# Patient Record
Sex: Male | Born: 1945
Health system: Southern US, Community
[De-identification: ages and names within clinical notes are randomized; demographics above are authoritative.]

## PROBLEM LIST (undated history)

## (undated) DIAGNOSIS — I10 Essential (primary) hypertension: Secondary | ICD-10-CM

## (undated) HISTORY — DX: Essential (primary) hypertension: I10

---

## 2014-02-19 ENCOUNTER — Ambulatory Visit (INDEPENDENT_AMBULATORY_CARE_PROVIDER_SITE_OTHER): Payer: Self-pay | Admitting: Family Medicine

## 2014-02-19 ENCOUNTER — Encounter: Payer: Self-pay | Admitting: Family Medicine

## 2014-02-19 ENCOUNTER — Encounter (INDEPENDENT_AMBULATORY_CARE_PROVIDER_SITE_OTHER): Payer: Self-pay

## 2014-02-19 VITALS — Temp 97.3°F | Ht 71.0 in | Wt 178.0 lb

## 2014-02-19 DIAGNOSIS — R21 Rash and other nonspecific skin eruption: Secondary | ICD-10-CM

## 2014-02-19 MED ORDER — TRIAMCINOLONE ACETONIDE 0.1 % EX CREA: TOPICAL_CREAM | CUTANEOUS | Status: DC

## 2014-02-19 MED ORDER — KETOCONAZOLE 2 % EX CREA: 1.0000 "application " | TOPICAL_CREAM | Freq: Two times a day (BID) | CUTANEOUS | Status: DC

## 2014-02-19 MED ORDER — DOXYCYCLINE HYCLATE 100 MG PO TABS: 100.0000 mg | ORAL_TABLET | Freq: Two times a day (BID) | ORAL | Status: DC
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
## 2014-03-29 ENCOUNTER — Ambulatory Visit: Payer: Self-pay | Admitting: Family

## 2014-03-29 VITALS — BP 155/74 | HR 75 | Temp 97.0°F | Ht 71.0 in | Wt 179.2 lb

## 2014-03-29 DIAGNOSIS — L259 Unspecified contact dermatitis, unspecified cause: Secondary | ICD-10-CM

## 2014-03-29 MED ORDER — METHYLPREDNISOLONE (PAK) 4 MG PO TABS: ORAL_TABLET | ORAL | Status: DC

## 2014-03-29 MED ORDER — METHYLPREDNISOLONE ACETATE 80 MG/ML IJ SUSP
80.0000 mg | Freq: Once | INTRAMUSCULAR | Status: AC
  Administered 2014-03-29: 80 mg via INTRAMUSCULAR

## 2014-03-29 MED ORDER — TRIAMCINOLONE ACETONIDE 0.5 % EX OINT: 1.0000 "application " | TOPICAL_OINTMENT | Freq: Two times a day (BID) | CUTANEOUS | Status: DC

## 2014-03-29 NOTE — Patient Instructions (Signed)

## 2014-03-29 NOTE — Progress Notes (Signed)
Subjective:    Patient ID: Michael Washington, male    DOB: 10-May-1945, 69 y.o.   MRN: [id number]  Rash This is a recurrent problem. The current episode started more than 1 month ago (December). The affected locations include the left arm and right arm. The rash is characterized by itchiness and redness. He was exposed to nothing. Pertinent negatives include no congestion. Past treatments include antibiotics, anti-itch cream and moisturizer (Ketoconazole). The treatment provided mild relief. There is no history of allergies or asthma.      Review of Systems  Constitutional: Negative.   HENT: Negative.  Negative for congestion.   Respiratory: Negative.   Cardiovascular: Negative.   Gastrointestinal: Negative.   Endocrine: Negative.   Genitourinary: Negative.   Musculoskeletal: Negative.   Skin: Positive for rash.  Neurological: Negative.   Hematological: Negative.   Psychiatric/Behavioral: Negative.   All other systems reviewed and are negative.      Objective:   Physical Exam  Constitutional: He is oriented to person, place, and time. He appears well-developed and well-nourished. No distress.  Eyes: Pupils are equal, round, and reactive to light. Right eye exhibits no discharge. Left eye exhibits no discharge.  Neck: Normal range of motion. Neck supple. No thyromegaly present.  Cardiovascular: Normal rate, regular rhythm, normal heart sounds and intact distal pulses.   No murmur heard. Pulmonary/Chest: Effort normal and breath sounds normal. No respiratory distress. He has no wheezes.  Abdominal: Soft. Bowel sounds are normal. He exhibits no distension. There is no tenderness.  Musculoskeletal: Normal range of motion. He exhibits no edema or tenderness.  Neurological: He is alert and oriented to person, place, and time. He has normal reflexes. No cranial nerve deficit.  Skin: Skin is warm and dry. Rash (Bilateral lower arms generalized erythemas rash ) noted. No erythema.    Psychiatric: He has a normal mood and affect. His behavior is normal. Judgment and thought content normal.  Vitals reviewed.   BP 155/74 mmHg  Pulse 75  Temp(Src) 97 F (36.1 C) (Oral)  Ht 5\' 11"  (1.803 m)  Wt 179 lb 4 oz (81.307 kg)  BMI 25.01 kg/m2       Assessment & Plan:  1. Contact dermatitis -Do not scratch -Keep clean and dry -S/S of infection discussed -RTO prn - methylPREDNISolone acetate (DEPO-MEDROL) injection 80 mg; Inject 1 mL (80 mg total) into the muscle once. - methylPREDNIsolone (MEDROL DOSPACK) 4 MG tablet; follow package directions  Dispense: 21 tablet; Refill: 0 - triamcinolone ointment (KENALOG) 0.5 %; Apply 1 application topically 2 (two) times daily.  Dispense: 30 g; Refill: 0  Jannifer Rodney, FNP

## 2014-12-08 ENCOUNTER — Ambulatory Visit (INDEPENDENT_AMBULATORY_CARE_PROVIDER_SITE_OTHER): Payer: Medicare Other | Admitting: Family

## 2014-12-08 ENCOUNTER — Encounter: Payer: Self-pay | Admitting: Family

## 2014-12-08 VITALS — BP 158/93 | HR 84 | Temp 97.7°F | Ht 71.0 in | Wt 175.2 lb

## 2014-12-08 DIAGNOSIS — L259 Unspecified contact dermatitis, unspecified cause: Secondary | ICD-10-CM

## 2014-12-08 MED ORDER — METHYLPREDNISOLONE 4 MG PO TBPK: ORAL_TABLET | ORAL | Status: DC

## 2014-12-08 MED ORDER — TRIAMCINOLONE ACETONIDE 0.5 % EX OINT: 1.0000 "application " | TOPICAL_OINTMENT | Freq: Two times a day (BID) | CUTANEOUS | Status: DC
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
## 2014-12-08 NOTE — Patient Instructions (Signed)
Contact Dermatitis Dermatitis is redness, soreness, and swelling (inflammation) of the skin. Contact dermatitis is a reaction to certain substances that touch the skin. There are two types of contact dermatitis:   Irritant contact dermatitis. This type is caused by something that irritates your skin, such as dry hands from washing them too much. This type does not require previous exposure to the substance for a reaction to occur. This type is more common.  Allergic contact dermatitis. This type is caused by a substance that you are allergic to, such as a nickel allergy or poison ivy. This type only occurs if you have been exposed to the substance (allergen) before. Upon a repeat exposure, your body reacts to the substance. This type is less common. CAUSES  Many different substances can cause contact dermatitis. Irritant contact dermatitis is most commonly caused by exposure to:   Makeup.   Soaps.   Detergents.   Bleaches.   Acids.   Metal salts, such as nickel.  Allergic contact dermatitis is most commonly caused by exposure to:   Poisonous plants.   Chemicals.   Jewelry.   Latex.   Medicines.   Preservatives in products, such as clothing.  RISK FACTORS This condition is more likely to develop in:   People who have jobs that expose them to irritants or allergens.  People who have certain medical conditions, such as asthma or eczema.  SYMPTOMS  Symptoms of this condition may occur anywhere on your body where the irritant has touched you or is touched by you. Symptoms include:  Dryness or flaking.   Redness.   Cracks.   Itching.   Pain or a burning feeling.   Blisters.  Drainage of small amounts of blood or clear fluid from skin cracks. With allergic contact dermatitis, there may also be swelling in areas such as the eyelids, mouth, or genitals.  DIAGNOSIS  This condition is diagnosed with a medical history and physical exam. A patch skin test  may be performed to help determine the cause. If the condition is related to your job, you may need to see an occupational medicine specialist. TREATMENT Treatment for this condition includes figuring out what caused the reaction and protecting your skin from further contact. Treatment may also include:   Steroid creams or ointments. Oral steroid medicines may be needed in more severe cases.  Antibiotics or antibacterial ointments, if a skin infection is present.  Antihistamine lotion or an antihistamine taken by mouth to ease itching.  A bandage (dressing). HOME CARE INSTRUCTIONS Skin Care  Moisturize your skin as needed.   Apply cool compresses to the affected areas.  Try taking a bath with:  Epsom salts. Follow the instructions on the packaging. You can get these at your local pharmacy or grocery store.  Baking soda. Pour a small amount into the bath as directed by your health care provider.  Colloidal oatmeal. Follow the instructions on the packaging. You can get this at your local pharmacy or grocery store.  Try applying baking soda paste to your skin. Stir water into baking soda until it reaches a paste-like consistency.  Do not scratch your skin.  Bathe less frequently, such as every other day.  Bathe in lukewarm water. Avoid using hot water. Medicines  Take or apply over-the-counter and prescription medicines only as told by your health care provider.   If you were prescribed an antibiotic medicine, take or apply your antibiotic as told by your health care provider. Do not stop using the   antibiotic even if your condition starts to improve. General Instructions  Keep all follow-up visits as told by your health care provider. This is important.  Avoid the substance that caused your reaction. If you do not know what caused it, keep a journal to try to track what caused it. Write down:  What you eat.  What cosmetic products you use.  What you drink.  What  you wear in the affected area. This includes jewelry.  If you were given a dressing, take care of it as told by your health care provider. This includes when to change and remove it. SEEK MEDICAL CARE IF:   Your condition does not improve with treatment.  Your condition gets worse.  You have signs of infection such as swelling, tenderness, redness, soreness, or warmth in the affected area.  You have a fever.  You have new symptoms. SEEK IMMEDIATE MEDICAL CARE IF:   You have a severe headache, neck pain, or neck stiffness.  You vomit.  You feel very sleepy.  You notice red streaks coming from the affected area.  Your bone or joint underneath the affected area becomes painful after the skin has healed.  The affected area turns darker.  You have difficulty breathing.   This information is not intended to replace advice given to you by your health care provider. Make sure you discuss any questions you have with your health care provider.   Document Released: 01/08/2000 Document Revised: 10/01/2014 Document Reviewed: 05/28/2014 Elsevier Interactive Patient Education 2016 [employer]  

## 2014-12-17 ENCOUNTER — Telehealth: Payer: Self-pay | Admitting: Family

## 2014-12-17 DIAGNOSIS — L259 Unspecified contact dermatitis, unspecified cause: Secondary | ICD-10-CM

## 2014-12-17 MED ORDER — METHYLPREDNISOLONE 4 MG PO TBPK: ORAL_TABLET | ORAL | Status: DC

## 2014-12-17 NOTE — Telephone Encounter (Signed)
Patient has developed rash on his back and legs now, arms are better, has finished Prednisone.  Wants to know if [facility] can [facility].

## 2014-12-17 NOTE — Telephone Encounter (Signed)
Prescription sent to pharmacy.

## 2015-01-08 ENCOUNTER — Encounter: Payer: Self-pay | Admitting: Family

## 2015-01-08 ENCOUNTER — Ambulatory Visit (INDEPENDENT_AMBULATORY_CARE_PROVIDER_SITE_OTHER): Payer: Medicare Other | Admitting: Family

## 2015-01-08 VITALS — BP 166/99 | HR 79 | Temp 97.6°F | Ht 71.0 in | Wt 174.2 lb

## 2015-01-08 DIAGNOSIS — Q828 Other specified congenital malformations of skin: Secondary | ICD-10-CM

## 2015-01-08 DIAGNOSIS — Q809 Congenital ichthyosis, unspecified: Secondary | ICD-10-CM

## 2015-01-08 DIAGNOSIS — L259 Unspecified contact dermatitis, unspecified cause: Secondary | ICD-10-CM | POA: Diagnosis not present

## 2015-01-08 MED ORDER — NYSTATIN-TRIAMCINOLONE 100000-0.1 UNIT/GM-% EX OINT: 1.0000 "application " | TOPICAL_OINTMENT | Freq: Two times a day (BID) | CUTANEOUS | Status: DC

## 2015-01-08 MED ORDER — METHYLPREDNISOLONE ACETATE 80 MG/ML IJ SUSP
80.0000 mg | Freq: Once | INTRAMUSCULAR | Status: AC
  Administered 2015-01-08: 80 mg via INTRAMUSCULAR

## 2015-01-08 MED ORDER — PREDNISONE 10 MG (21) PO TBPK: 10.0000 mg | ORAL_TABLET | Freq: Every day | ORAL | Status: DC

## 2015-01-08 NOTE — Patient Instructions (Signed)
Contact Dermatitis Dermatitis is redness, soreness, and swelling (inflammation) of the skin. Contact dermatitis is a reaction to certain substances that touch the skin. There are two types of contact dermatitis:   Irritant contact dermatitis. This type is caused by something that irritates your skin, such as dry hands from washing them too much. This type does not require previous exposure to the substance for a reaction to occur. This type is more common.  Allergic contact dermatitis. This type is caused by a substance that you are allergic to, such as a nickel allergy or poison ivy. This type only occurs if you have been exposed to the substance (allergen) before. Upon a repeat exposure, your body reacts to the substance. This type is less common. CAUSES  Many different substances can cause contact dermatitis. Irritant contact dermatitis is most commonly caused by exposure to:   Makeup.   Soaps.   Detergents.   Bleaches.   Acids.   Metal salts, such as nickel.  Allergic contact dermatitis is most commonly caused by exposure to:   Poisonous plants.   Chemicals.   Jewelry.   Latex.   Medicines.   Preservatives in products, such as clothing.  RISK FACTORS This condition is more likely to develop in:   People who have jobs that expose them to irritants or allergens.  People who have certain medical conditions, such as asthma or eczema.  SYMPTOMS  Symptoms of this condition may occur anywhere on your body where the irritant has touched you or is touched by you. Symptoms include:  Dryness or flaking.   Redness.   Cracks.   Itching.   Pain or a burning feeling.   Blisters.  Drainage of small amounts of blood or clear fluid from skin cracks. With allergic contact dermatitis, there may also be swelling in areas such as the eyelids, mouth, or genitals.  DIAGNOSIS  This condition is diagnosed with a medical history and physical exam. A patch skin test  may be performed to help determine the cause. If the condition is related to your job, you may need to see an occupational medicine specialist. TREATMENT Treatment for this condition includes figuring out what caused the reaction and protecting your skin from further contact. Treatment may also include:   Steroid creams or ointments. Oral steroid medicines may be needed in more severe cases.  Antibiotics or antibacterial ointments, if a skin infection is present.  Antihistamine lotion or an antihistamine taken by mouth to ease itching.  A bandage (dressing). HOME CARE INSTRUCTIONS Skin Care  Moisturize your skin as needed.   Apply cool compresses to the affected areas.  Try taking a bath with:  Epsom salts. Follow the instructions on the packaging. You can get these at your local pharmacy or grocery store.  Baking soda. Pour a small amount into the bath as directed by your health care provider.  Colloidal oatmeal. Follow the instructions on the packaging. You can get this at your local pharmacy or grocery store.  Try applying baking soda paste to your skin. Stir water into baking soda until it reaches a paste-like consistency.  Do not scratch your skin.  Bathe less frequently, such as every other day.  Bathe in lukewarm water. Avoid using hot water. Medicines  Take or apply over-the-counter and prescription medicines only as told by your health care provider.   If you were prescribed an antibiotic medicine, take or apply your antibiotic as told by your health care provider. Do not stop using the   antibiotic even if your condition starts to improve. General Instructions  Keep all follow-up visits as told by your health care provider. This is important.  Avoid the substance that caused your reaction. If you do not know what caused it, keep a journal to try to track what caused it. Write down:  What you eat.  What cosmetic products you use.  What you drink.  What  you wear in the affected area. This includes jewelry.  If you were given a dressing, take care of it as told by your health care provider. This includes when to change and remove it. SEEK MEDICAL CARE IF:   Your condition does not improve with treatment.  Your condition gets worse.  You have signs of infection such as swelling, tenderness, redness, soreness, or warmth in the affected area.  You have a fever.  You have new symptoms. SEEK IMMEDIATE MEDICAL CARE IF:   You have a severe headache, neck pain, or neck stiffness.  You vomit.  You feel very sleepy.  You notice red streaks coming from the affected area.  Your bone or joint underneath the affected area becomes painful after the skin has healed.  The affected area turns darker.  You have difficulty breathing.   This information is not intended to replace advice given to you by your health care provider. Make sure you discuss any questions you have with your health care provider.   Document Released: 01/08/2000 Document Revised: 10/01/2014 Document Reviewed: 05/28/2014 Elsevier Interactive Patient Education 2016 [employer]  

## 2015-01-08 NOTE — Progress Notes (Signed)
Subjective:    Patient ID: Michael Washington, male    DOB: Feb 27, 1945, 69 y.o.   MRN: [id number]  Pt presents to the office today for recurrent rash. Pt was seen in the office on 01/16/15 and was treated with steroid dose pack and Kenalong cream. Pt reports the rash going away, but returning after completing the steroids.  Rash This is a recurrent problem. The current episode started 1 to 4 weeks ago. The problem has been waxing and waning since onset. The affected locations include the right arm and left arm. The rash is characterized by itchiness, dryness and redness. He was exposed to nothing. Pertinent negatives include no congestion, diarrhea, eye pain, fatigue, joint pain, shortness of breath or sore throat. Past treatments include moisturizer, oral steroids, topical steroids and anti-itch cream. The treatment provided moderate relief.      Review of Systems  Constitutional: Negative.  Negative for fatigue.  HENT: Negative.  Negative for congestion and sore throat.   Eyes: Negative for pain.  Respiratory: Negative.  Negative for shortness of breath.   Cardiovascular: Negative.   Gastrointestinal: Negative.  Negative for diarrhea.  Endocrine: Negative.   Genitourinary: Negative.   Musculoskeletal: Negative.  Negative for joint pain.  Skin: Positive for rash.  Neurological: Negative.   Hematological: Negative.   Psychiatric/Behavioral: Negative.   All other systems reviewed and are negative.      Objective:   Physical Exam  Constitutional: He is oriented to person, place, and time. He appears well-developed and well-nourished. No distress.  HENT:  Head: Normocephalic.  Right Ear: External ear normal.  Left Ear: External ear normal.  Mouth/Throat: Oropharynx is clear and moist.  Eyes: Pupils are equal, round, and reactive to light. Right eye exhibits no discharge. Left eye exhibits no discharge.  Neck: Normal range of motion. Neck supple. No thyromegaly present.    Cardiovascular: Normal rate, regular rhythm, normal heart sounds and intact distal pulses.   No murmur heard. Pulmonary/Chest: Effort normal and breath sounds normal. No respiratory distress. He has no wheezes.  Abdominal: Soft. Bowel sounds are normal. He exhibits no distension. There is no tenderness.  Musculoskeletal: Normal range of motion. He exhibits no edema or tenderness.  Neurological: He is alert and oriented to person, place, and time. He has normal reflexes. No cranial nerve deficit.  Skin: Skin is warm and dry. Rash noted. There is erythema (Erythemas generalized rash on bilateral arms from wrist to elbow).  Psychiatric: He has a normal mood and affect. His behavior is normal. Judgment and thought content normal.  Vitals reviewed.     BP 166/99 mmHg  Pulse 79  Temp(Src) 97.6 F (36.4 C) (Oral)  Ht 5\' 11"  (1.803 m)  Wt 174 lb 3.2 oz (79.017 kg)  BMI 24.31 kg/m2     Assessment & Plan:  1. Contact dermatitis -Do not scratch  -Cool compresses as needed -Wear long sleeves while outside working -RTO in 1 week for chronic follow up!!! - methylPREDNISolone acetate (DEPO-MEDROL) injection 80 mg; Inject 1 mL (80 mg total) into the muscle once. - predniSONE (STERA-Do not scratch  -Cool compresses as needed -Wear long sleeves while outside workingRED UNI-PAK 21 TAB) 10 MG (21) TBPK tablet; Take 1 tablet (10 mg total) by mouth daily. Use as directed over 6 days  Dispense: 21 tablet; Refill: 0 - nystatin-triamcinolone ointment (MYCOLOG); Apply 1 application topically 2 (two) times daily.  Dispense: 30 g; Refill: 0  2. Xeroderma -Apply Vaseline and thick non scent  moisturizer on bilateral arms  - methylPREDNISolone acetate (DEPO-MEDROL) injection 80 mg; Inject 1 mL (80 mg total) into the muscle once.  Jannifer Rodney, FNP

## 2015-02-19 ENCOUNTER — Encounter: Payer: Self-pay | Admitting: Family Medicine

## 2015-02-19 ENCOUNTER — Ambulatory Visit (INDEPENDENT_AMBULATORY_CARE_PROVIDER_SITE_OTHER): Payer: Medicare Other | Admitting: Family Medicine

## 2015-02-19 VITALS — BP 151/83 | HR 81 | Temp 97.7°F | Ht 71.0 in | Wt 174.8 lb

## 2015-02-19 DIAGNOSIS — R21 Rash and other nonspecific skin eruption: Secondary | ICD-10-CM | POA: Diagnosis not present

## 2015-02-19 MED ORDER — TRIAMCINOLONE ACETONIDE 40 MG/ML IJ SUSP
40.0000 mg | Freq: Once | INTRAMUSCULAR | Status: AC
  Administered 2015-02-19: 40 mg via INTRAMUSCULAR

## 2015-02-19 MED ORDER — CEPHALEXIN 500 MG PO CAPS: 500.0000 mg | ORAL_CAPSULE | Freq: Three times a day (TID) | ORAL | Status: DC

## 2015-02-19 MED ORDER — TRIAMCINOLONE ACETONIDE 0.5 % EX OINT: 1.0000 "application " | TOPICAL_OINTMENT | Freq: Two times a day (BID) | CUTANEOUS | Status: DC

## 2015-02-19 NOTE — Progress Notes (Signed)
   HPI  Patient presents today for skin rash.  Patient has had recurrent skin rash over the last year and a half or 2 years. It usually resolves with a shot of steroids and steroid ointments.  He states that it's been present now for about 3-4 weeks, got much worse after ussed gold bond ointment He has some mild nail changes he states have always been there present He has no other rashes anywhere in his body. No fevers, chills, sweats. He denies any weeping or drainage from any of the areas that are open.   PMH: Smoking status noted ROS: Per HPI  Objective: BP 151/83 mmHg  Pulse 81  Temp(Src) 97.7 F (36.5 C) (Oral)  Ht  (1.803 m)  Wt 174 lb 12.8 oz (79.289 kg)  BMI 24.39 kg/m2 Gen: NAD, alert, cooperative with exam HEENT: NCAT, EOMI, PERRL Ext: No edema, warm Neuro: Alert and oriented, No gross deficits  Skin: Erythematous icthyotic scaly skin on bilateral forearms with fading to scattered erythematous papules on upper arms bilaterally, on bilateral dorsal wrists he has very thickened skin with several openings in the skin folds, some are beginning to swell slightly with no drainage.   Assessment and plan:  # Skin rash, Unclear absolute diagnosis, however xerosis, eczema, or psoriasis seem to be plausible causes. Contact dermatitis also possible Given a shot of IM Kenalog today Triamcinolone prescribed Discussed supportive care for xerosis Avoid Gold Bond ointment Refer to dermatology with recurrent rash and nail changes  Orders Placed This Encounter  Procedures  . Ambulatory referral to Dermatology    Referral Priority:  Routine    Referral Type:  Consultation    Referral Reason:  Specialty Services Required    Requested Specialty:  Dermatology    Number of Visits Requested:  1    Meds ordered this encounter  Medications  . triamcinolone ointment (KENALOG) 0.5 %    Sig: Apply 1 application topically 2 (two) times daily. Do not use for more than 10  days straight. avoid underarms, face, and groin    Dispense:  30 g    Refill:  2  . cephALEXin (KEFLEX) 500 MG capsule    Sig: Take 1 capsule (500 mg total) by mouth 3 (three) times daily.    Dispense:  21 capsule    Refill:  0    Murtis Sink, MD [facility] 02/19/2015, 9:01 AM

## 2015-02-19 NOTE — Patient Instructions (Addendum)
Great to meet you!  Try to take the coolest , wuickest shower you can tolerate Use plain dove bar soap Use unscented lotion, CeraVe (at [employer] and best price on [address]) and curel  Use vaseline on the thickest open lesions  I have also prescribed keflex, an antibiotic for the open lesions on your hands to be sure there is no infection present.

## 2015-02-19 NOTE — Addendum Note (Signed)
Addended by: Angela Adam on: 02/19/2015 09:15 AM   Modules accepted: Orders

## 2015-08-20 ENCOUNTER — Other Ambulatory Visit: Payer: Self-pay | Admitting: Family Medicine

## 2016-01-04 ENCOUNTER — Telehealth: Payer: Self-pay | Admitting: Family Medicine

## 2016-01-04 NOTE — Telephone Encounter (Signed)
Pt given appt tomorrow with Neysa Bonitohristy at 3:40.

## 2016-01-04 NOTE — Telephone Encounter (Signed)
Pt will need to be seen

## 2016-01-05 ENCOUNTER — Ambulatory Visit (INDEPENDENT_AMBULATORY_CARE_PROVIDER_SITE_OTHER): Payer: Medicare Other | Admitting: Family

## 2016-01-05 ENCOUNTER — Encounter: Payer: Self-pay | Admitting: Family

## 2016-01-05 VITALS — BP 164/91 | HR 73 | Temp 98.4°F | Ht 71.0 in | Wt 171.0 lb

## 2016-01-05 DIAGNOSIS — L249 Irritant contact dermatitis, unspecified cause: Secondary | ICD-10-CM | POA: Diagnosis not present

## 2016-01-05 DIAGNOSIS — I10 Essential (primary) hypertension: Secondary | ICD-10-CM

## 2016-01-05 MED ORDER — TRIAMCINOLONE ACETONIDE 0.5 % EX OINT: TOPICAL_OINTMENT | CUTANEOUS | 2 refills | Status: DC

## 2016-01-05 MED ORDER — PREDNISONE 10 MG (21) PO TBPK: ORAL_TABLET | ORAL | 0 refills | Status: DC

## 2016-01-05 MED ORDER — LISINOPRIL 20 MG PO TABS: 20.0000 mg | ORAL_TABLET | Freq: Every day | ORAL | 3 refills | Status: DC

## 2016-01-05 NOTE — Progress Notes (Signed)
   Subjective:    Patient ID: Michael Washington, male    DOB: [birth year], 70 y.o.   MRN: [id number]  Rash  This is a recurrent problem. The current episode started 1 to 4 weeks ago. The problem has been waxing and waning since onset. The affected locations include the left arm and right arm. The rash is characterized by pain, redness and swelling. Pertinent negatives include no congestion, cough, diarrhea, fatigue, fever, joint pain, shortness of breath, sore throat or vomiting. Past treatments include anti-itch cream. The treatment provided mild relief.      Review of Systems  Constitutional: Negative for fatigue and fever.  HENT: Negative for congestion and sore throat.   Respiratory: Negative for cough and shortness of breath.   Gastrointestinal: Negative for diarrhea and vomiting.  Musculoskeletal: Negative for joint pain.  Skin: Positive for rash.  All other systems reviewed and are negative.      Objective:   Physical Exam  Constitutional: He is oriented to person, place, and time. He appears well-developed and well-nourished.  Cardiovascular: Normal rate, regular rhythm, normal heart sounds and intact distal pulses.   Pulmonary/Chest: Effort normal and breath sounds normal.  Musculoskeletal: Normal range of motion. He exhibits no edema.  Neurological: He is alert and oriented to person, place, and time.  Skin: Skin is warm and dry. There is erythema.  Generalized Erythemas rash on bilateral arms that start at [facility] and extend to hands     BP (!) 180/101   Pulse 73   Temp 98.4 F (36.9 C) (Oral)   Ht _0  (1.803 m)   Wt 171 lb (77.6 kg)   BMI 23.85 kg/m       Assessment & Plan:  1. Irritant contact dermatitis, unspecified trigger -Do not scratch -Pt does not want to go to Derm at this time - triamcinolone ointment (KENALOG) 0.5 %; APPLY 2 TIMES A DAY.DONT USE FOR MORE THAN 10 DAYS STRAIGHT-AVOID UNDERARMS,FACE,GROIN  Dispense: 60 g; Refill: 2 - predniSONE (STERAPRED  UNI-PAK 21 TAB) 10 MG (21) TBPK tablet; Use as directed  Dispense: 21 tablet; Refill: 0 - BMP8+EGFR  2. Essential hypertension Pt started on lisinopril 20 mg today -Dash diet information given -Exercise encouraged - Stress Management  -Continue current meds -RTO in 2 weeks - BMP8+EGFR - lisinopril (PRINIVIL,ZESTRIL) 20 MG tablet; Take 1 tablet (20 mg total) by mouth daily.  Dispense: 90 tablet; Refill: [place], FNP

## 2016-01-05 NOTE — Patient Instructions (Signed)
Hypertension Hypertension, commonly called high blood pressure, is when the force of blood pumping through your arteries is too strong. Your arteries are the blood vessels that carry blood from your heart throughout your body. A blood pressure reading consists of a higher number over a lower number, such as 110/72. The higher number (systolic) is the pressure inside your arteries when your heart pumps. The lower number (diastolic) is the pressure inside your arteries when your heart relaxes. Ideally you want your blood pressure below 120/80. Hypertension forces your heart to work harder to pump blood. Your arteries may become narrow or stiff. Having untreated or uncontrolled hypertension can cause heart attack, stroke, kidney disease, and other problems. What increases the risk? Some risk factors for high blood pressure are controllable. Others are not. Risk factors you cannot control include:  Race. You may be at higher risk if you are African American.  Age. Risk increases with age.  Gender. Men are at higher risk than women before age 45 years. After age 65, women are at higher risk than men. Risk factors you can control include:  Not getting enough exercise or physical activity.  Being overweight.  Getting too much fat, sugar, calories, or salt in your diet.  Drinking too much alcohol. What are the signs or symptoms? Hypertension does not usually cause signs or symptoms. Extremely high blood pressure (hypertensive crisis) may cause headache, anxiety, shortness of breath, and nosebleed. How is this diagnosed? To check if you have hypertension, your health care provider will measure your blood pressure while you are seated, with your arm held at the level of your heart. It should be measured at least twice using the same arm. Certain conditions can cause a difference in blood pressure between your right and left arms. A blood pressure reading that is higher than normal on one occasion does  not mean that you need treatment. If it is not clear whether you have high blood pressure, you may be asked to return on a different day to have your blood pressure checked again. Or, you may be asked to monitor your blood pressure at home for 1 or more weeks. How is this treated? Treating high blood pressure includes making lifestyle changes and possibly taking medicine. Living a healthy lifestyle can help lower high blood pressure. You may need to change some of your habits. Lifestyle changes may include:  Following the DASH diet. This diet is high in fruits, vegetables, and whole grains. It is low in salt, red meat, and added sugars.  Keep your sodium intake below 2,300 mg per day.  Getting at least 30-45 minutes of aerobic exercise at least 4 times per week.  Losing weight if necessary.  Not smoking.  Limiting alcoholic beverages.  Learning ways to reduce stress. Your health care provider may prescribe medicine if lifestyle changes are not enough to get your blood pressure under control, and if one of the following is true:  You are 18-59 years of age and your systolic blood pressure is above 140.  You are 60 years of age or older, and your systolic blood pressure is above 150.  Your diastolic blood pressure is above 90.  You have diabetes, and your systolic blood pressure is over 140 or your diastolic blood pressure is over 90.  You have kidney disease and your blood pressure is above 140/90.  You have heart disease and your blood pressure is above 140/90. Your personal target blood pressure may vary depending on your medical   conditions, your age, and other factors. Follow these instructions at home:  Have your blood pressure rechecked as directed by your health care provider.  Take medicines only as directed by your health care provider. Follow the directions carefully. Blood pressure medicines must be taken as prescribed. The medicine does not work as well when you skip  doses. Skipping doses also puts you at risk for problems.  Do not smoke.  Monitor your blood pressure at home as directed by your health care provider. Contact a health care provider if:  You think you are having a reaction to medicines taken.  You have recurrent headaches or feel dizzy.  You have swelling in your ankles.  You have trouble with your vision. Get help right away if:  You develop a severe headache or confusion.  You have unusual weakness, numbness, or feel faint.  You have severe chest or abdominal pain.  You vomit repeatedly.  You have trouble breathing. This information is not intended to replace advice given to you by your health care provider. Make sure you discuss any questions you have with your health care provider. Document Released: 01/10/2005 Document Revised: 06/18/2015 Document Reviewed: 11/02/2012 Elsevier Interactive Patient Education  2017 [employer]  

## 2016-01-06 LAB — BMP8+EGFR
BUN/Creatinine Ratio: 16 (ref 10–24)
BUN: 17 mg/dL (ref 8–27)
CALCIUM: 9.4 mg/dL (ref 8.6–10.2)
CHLORIDE: 102 mmol/L (ref 96–106)
CO2: 22 mmol/L (ref 18–29)
Creatinine, Ser: 1.04 mg/dL (ref 0.76–1.27)
GFR calc Af Amer: 85 mL/min/{1.73_m2} (ref >59)
GFR, EST NON AFRICAN AMERICAN: 74 mL/min/{1.73_m2} (ref >59)
GLUCOSE: 91 mg/dL (ref 65–99)
POTASSIUM: 4.3 mmol/L (ref 3.5–5.2)
SODIUM: 141 mmol/L (ref 134–144)

## 2016-02-12 ENCOUNTER — Ambulatory Visit (INDEPENDENT_AMBULATORY_CARE_PROVIDER_SITE_OTHER): Payer: Medicare HMO | Admitting: Family

## 2016-02-12 ENCOUNTER — Encounter: Payer: Self-pay | Admitting: Family

## 2016-02-12 ENCOUNTER — Encounter (INDEPENDENT_AMBULATORY_CARE_PROVIDER_SITE_OTHER): Payer: Self-pay

## 2016-02-12 VITALS — BP 141/82 | HR 80 | Temp 97.3°F | Ht 71.0 in | Wt 173.2 lb

## 2016-02-12 DIAGNOSIS — M7989 Other specified soft tissue disorders: Secondary | ICD-10-CM

## 2016-02-12 DIAGNOSIS — L03012 Cellulitis of left finger: Secondary | ICD-10-CM | POA: Diagnosis not present

## 2016-02-12 MED ORDER — SULFAMETHOXAZOLE-TRIMETHOPRIM 800-160 MG PO TABS: 1.0000 | ORAL_TABLET | Freq: Two times a day (BID) | ORAL | 0 refills | Status: DC

## 2016-02-12 NOTE — Patient Instructions (Signed)

## 2016-02-12 NOTE — Progress Notes (Signed)
   Subjective:    Patient ID: Michael Washington, male    DOB: [birth year], 71 y.o.   MRN: [id number]  HPI Pt presents to the office today with left thumb swelling, erythemas, tenderness, and warmth. PT states he noticed "knot" on his knuckles on Saturday and then on Wednesday he noticed the swelling. Pt has tried antibiotic cream and soaked in warm water with no relief. PT denies any history of gout.    Review of Systems  HENT: Negative.   Respiratory: Negative.   Cardiovascular: Negative.   Gastrointestinal: Negative.   Musculoskeletal: Positive for joint swelling.       Objective:   Physical Exam  Constitutional: He appears well-developed and well-nourished.  Cardiovascular: Normal rate, regular rhythm, normal heart sounds and intact distal pulses.   No murmur heard. Pulmonary/Chest: Effort normal and breath sounds normal.  Abdominal: Soft. Bowel sounds are normal.  Musculoskeletal: He exhibits edema (left thumb) and tenderness (left thumb with erythmeas warmth, redness extending from base of left thumb nail approx 5.8X4.5 mm ).  Skin: Skin is warm and dry. There is erythema.  Psychiatric: He has a normal mood and affect. His behavior is normal. Judgment and thought content normal.    BP (!) 141/82   Pulse 80   Temp 97.3 F (36.3 C) (Oral)   Ht '5\' 11"'$  (1.803 m)   Wt 173 lb 3.2 oz (78.6 kg)   BMI 24.16 kg/m        Assessment & Plan:  1. Swollen thumb - BMP8+EGFR - Uric acid - sulfamethoxazole-trimethoprim (BACTRIM DS) 800-160 MG tablet; Take 1 tablet by mouth 2 (two) times daily.  Dispense: 14 tablet; Refill: 0  2. Cellulitis of thumb, left -Area marked Pt told to call if erythemas increase or worsens Keep elevated when possible RTO in 2 weeks to recheck - BMP8+EGFR - sulfamethoxazole-trimethoprim (BACTRIM DS) 800-160 MG tablet; Take 1 tablet by mouth 2 (two) times daily.  Dispense: 14 tablet; Refill: 0  Evelina Dun, FNP

## 2016-02-13 LAB — BMP8+EGFR
BUN/Creatinine Ratio: 12 (ref 10–24)
BUN: 14 mg/dL (ref 8–27)
CALCIUM: 9.3 mg/dL (ref 8.6–10.2)
CO2: 24 mmol/L (ref 18–29)
CREATININE: 1.17 mg/dL (ref 0.76–1.27)
Chloride: 99 mmol/L (ref 96–106)
GFR calc Af Amer: 74 mL/min/{1.73_m2} (ref >59)
GFR, EST NON AFRICAN AMERICAN: 64 mL/min/{1.73_m2} (ref >59)
Glucose: 101 mg/dL — ABNORMAL HIGH (ref 65–99)
POTASSIUM: 4.5 mmol/L (ref 3.5–5.2)
Sodium: 142 mmol/L (ref 134–144)

## 2016-02-13 LAB — URIC ACID: Uric Acid: 7 mg/dL (ref 3.7–8.6)

## 2016-02-22 ENCOUNTER — Encounter: Payer: Self-pay | Admitting: Family

## 2016-02-22 ENCOUNTER — Ambulatory Visit (INDEPENDENT_AMBULATORY_CARE_PROVIDER_SITE_OTHER): Payer: Medicare HMO

## 2016-02-22 ENCOUNTER — Ambulatory Visit (INDEPENDENT_AMBULATORY_CARE_PROVIDER_SITE_OTHER): Payer: Medicare HMO | Admitting: Family

## 2016-02-22 VITALS — BP 121/76 | HR 84 | Temp 96.8°F | Ht 71.0 in | Wt 173.2 lb

## 2016-02-22 DIAGNOSIS — M79645 Pain in left finger(s): Secondary | ICD-10-CM

## 2016-02-22 DIAGNOSIS — M7989 Other specified soft tissue disorders: Secondary | ICD-10-CM

## 2016-02-22 DIAGNOSIS — L089 Local infection of the skin and subcutaneous tissue, unspecified: Secondary | ICD-10-CM | POA: Diagnosis not present

## 2016-02-22 MED ORDER — DOXYCYCLINE HYCLATE 100 MG PO TABS: 100.0000 mg | ORAL_TABLET | Freq: Two times a day (BID) | ORAL | 0 refills | Status: DC

## 2016-02-22 NOTE — Patient Instructions (Signed)

## 2016-02-22 NOTE — Progress Notes (Signed)
Subjective:    Patient ID: Michael Washington, male    DOB: [birth year], 71 y.o.   MRN: [id number]  HPI Pt presents to the office today to recheck left thumb infection. Pt was given bactrim and completed it 3 days ago. Pt states the swelling and redness has improved, but continue to sore. PT states he is having intermittent pain of 8 out 10. Pt states the cold makes the pain worse and he can not bend his thumb.    Review of Systems  Musculoskeletal: Positive for arthralgias (left thumb).  All other systems reviewed and are negative.      Objective:   Physical Exam  Constitutional: He is oriented to person, place, and time. He appears well-developed and well-nourished.  Cardiovascular: Normal rate, regular rhythm, normal heart sounds and intact distal pulses.   Pulmonary/Chest: Effort normal and breath sounds normal.  Abdominal: Soft. Bowel sounds are normal. There is no tenderness.  Musculoskeletal: He exhibits edema and tenderness.  Erythemas around joint of Left thumb, warmth present    Neurological: He is alert and oriented to person, place, and time.  Skin: Skin is warm and dry.  Psychiatric: He has a normal mood and affect. His behavior is normal. Judgment and thought content normal.   Thumb x-ray- Abnormal Preliminary reading by Jannifer Rodney, FNP [facility]    BP (!) 142/76   Pulse 84   Temp (!) 96.8 F (36 C) (Oral)   Ht 5\' 11"  (1.803 m)   Wt 173 lb 3.2 oz (78.6 kg)   BMI 24.16 kg/m       Assessment & Plan:  1. Pain of left thumb - DG Finger Thumb Left; Future - Ambulatory referral to Orthopedic Surgery - doxycycline (VIBRA-TABS) 100 MG tablet; Take 1 tablet (100 mg total) by mouth 2 (two) times daily.  Dispense: 20 tablet; Refill: 0  2. Thumb swelling - DG Finger Thumb Left; Future - Ambulatory referral to Orthopedic Surgery  3. Infection of thumb - Ambulatory referral to Orthopedic Surgery - doxycycline (VIBRA-TABS) 100 MG tablet; Take 1 tablet (100 mg total)  by mouth 2 (two) times daily.  Dispense: 20 tablet; Refill: 0  Will do referral to ortho since redness and warmth since present Rest  Keep elevated Call if erythemas or swelling becomes worse Redness marked on thumb   Jannifer Rodney, FNP

## 2016-02-23 ENCOUNTER — Other Ambulatory Visit: Payer: Self-pay | Admitting: Family

## 2016-05-07 DIAGNOSIS — L309 Dermatitis, unspecified: Secondary | ICD-10-CM | POA: Diagnosis not present

## 2016-05-07 DIAGNOSIS — Z87891 Personal history of nicotine dependence: Secondary | ICD-10-CM | POA: Diagnosis not present

## 2016-05-07 DIAGNOSIS — I10 Essential (primary) hypertension: Secondary | ICD-10-CM | POA: Diagnosis not present

## 2016-05-07 DIAGNOSIS — Z6824 Body mass index (BMI) 24.0-24.9, adult: Secondary | ICD-10-CM | POA: Diagnosis not present

## 2016-05-07 DIAGNOSIS — H9313 Tinnitus, bilateral: Secondary | ICD-10-CM | POA: Diagnosis not present

## 2016-05-07 DIAGNOSIS — Z0000 Encounter for general adult medical examination without abnormal findings: Secondary | ICD-10-CM | POA: Diagnosis not present

## 2016-09-01 DIAGNOSIS — B354 Tinea corporis: Secondary | ICD-10-CM | POA: Diagnosis not present

## 2016-12-23 ENCOUNTER — Other Ambulatory Visit: Payer: Self-pay | Admitting: Family

## 2016-12-23 DIAGNOSIS — I10 Essential (primary) hypertension: Secondary | ICD-10-CM

## 2016-12-23 NOTE — Telephone Encounter (Signed)
Last seen 02/22/16  Lawanna KobusAngel

## 2017-02-09 IMAGING — DX DG FINGER THUMB 2+V*L*
3 series · 3 of 3 positions shown · non-contrast
Comparison: None.

CLINICAL DATA: Pain and swelling in left thumb since 02/13/2016.
Warm to touch.

EXAM:
LEFT THUMB 2+V

[finger ap]
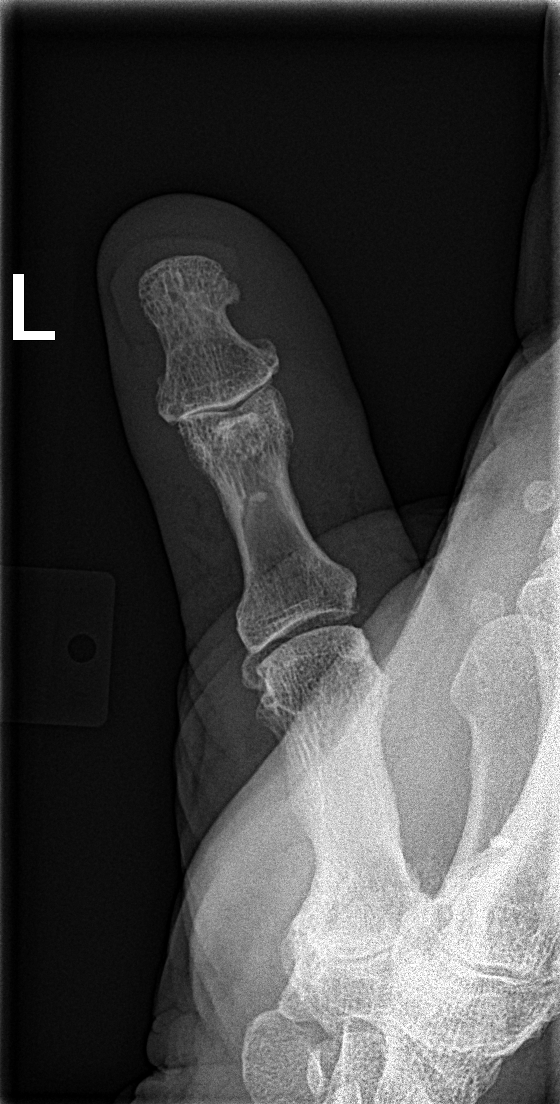

[finger obl]
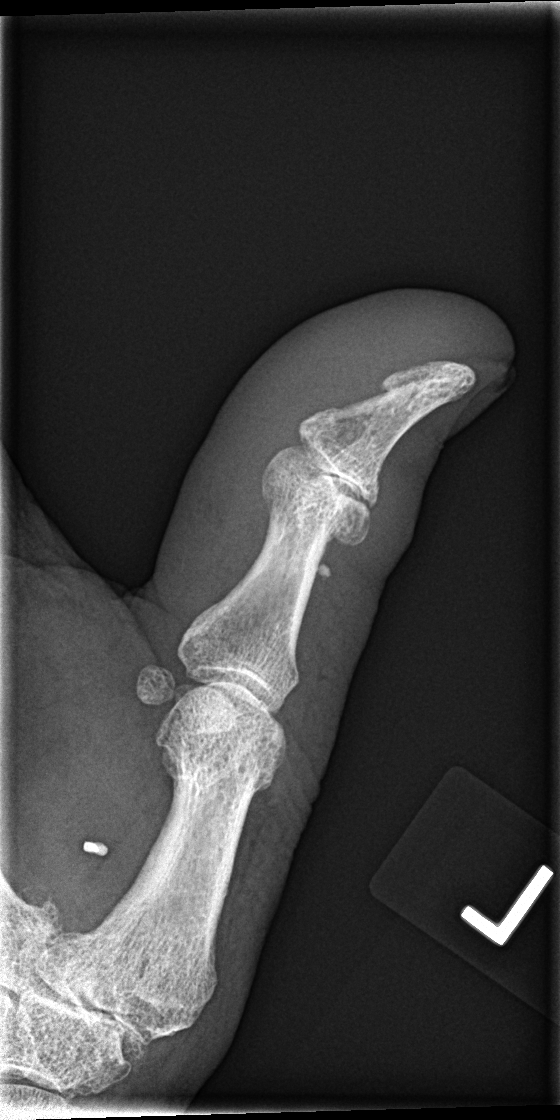

[finger lat]
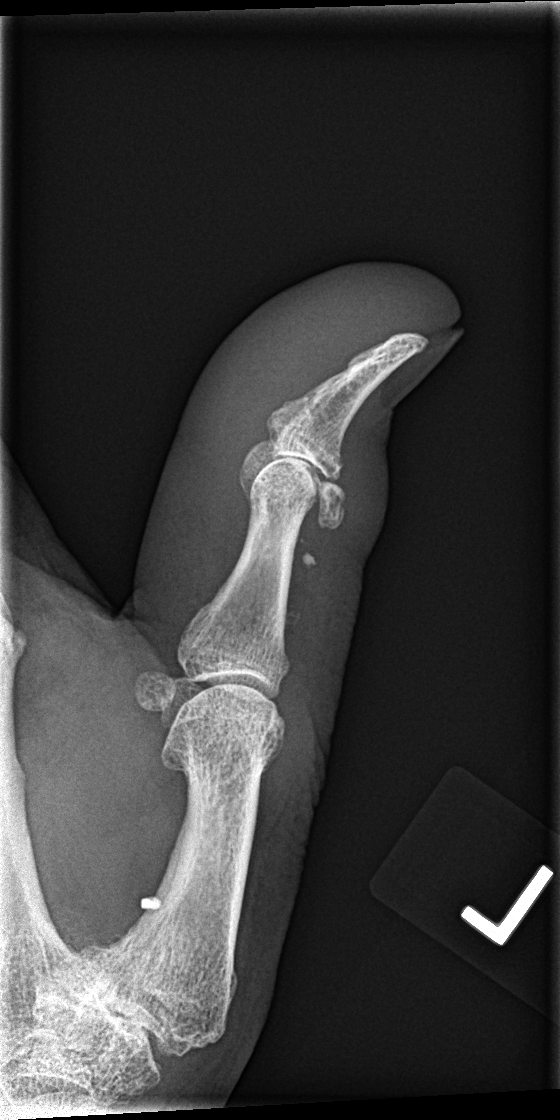

[3 of 3 positions shown; findings below may reference images not displayed]

FINDINGS: Osseous alignment is normal. There are changes of degenerative
osteoarthritis at the first CMC joint, at least moderate in degree
with associated joint space narrowing and osseous spurring.

No acute or suspicious osseous lesions seen. No erosions or other
signs of an inflammatory arthritis. No fracture line.

Small calcific density fragments are seen adjacent to the proximal
phalanx and middle phalanx, of uncertain age or etiology.
IMPRESSION: 1. No acute findings. No osseous fracture or dislocation. No acute
or suspicious osseous lesion. No evidence of osteomyelitis.
2. Degenerative osteoarthritis at the first CMC joint.
3. Small calcific density fragments adjacent to the proximal phalanx
and middle phalanx, of uncertain age or significance, possibly
related to previous trauma.

## 2017-03-21 ENCOUNTER — Other Ambulatory Visit: Payer: Self-pay | Admitting: Physician Assistant

## 2017-03-21 DIAGNOSIS — I10 Essential (primary) hypertension: Secondary | ICD-10-CM

## 2017-09-08 ENCOUNTER — Encounter: Payer: Self-pay | Admitting: Family

## 2017-09-08 ENCOUNTER — Ambulatory Visit: Payer: Medicare PPO | Admitting: Family

## 2017-09-08 VITALS — BP 135/78 | HR 75 | Temp 97.9°F | Ht 70.0 in | Wt 166.8 lb

## 2017-09-08 DIAGNOSIS — Z1159 Encounter for screening for other viral diseases: Secondary | ICD-10-CM

## 2017-09-08 DIAGNOSIS — Z1211 Encounter for screening for malignant neoplasm of colon: Secondary | ICD-10-CM

## 2017-09-08 DIAGNOSIS — Z0000 Encounter for general adult medical examination without abnormal findings: Secondary | ICD-10-CM

## 2017-09-08 DIAGNOSIS — I10 Essential (primary) hypertension: Secondary | ICD-10-CM

## 2017-09-08 DIAGNOSIS — Z23 Encounter for immunization: Secondary | ICD-10-CM

## 2017-09-08 DIAGNOSIS — Z1212 Encounter for screening for malignant neoplasm of rectum: Secondary | ICD-10-CM

## 2017-09-08 MED ORDER — LISINOPRIL 10 MG PO TABS: 10.0000 mg | ORAL_TABLET | Freq: Every day | ORAL | 3 refills | Status: DC

## 2017-09-08 NOTE — Progress Notes (Signed)
   Subjective:    Patient ID: Michael Washington, male    DOB: 1945/10/07, 72 y.o.   MRN: [id number]  Chief Complaint  Patient presents with  . Hypertension    recheck  . Annual Exam   PT presents to the office today for CPE. PT states he has been taking his BP at home and his BP has been running 110's/60's. He states when he stands up he felt light headed.  Hypertension  This is a chronic problem. The current episode started more than 1 year ago. The problem has been resolved since onset. The problem is controlled. Pertinent negatives include no headaches, malaise/fatigue, orthopnea, peripheral edema or shortness of breath. Risk factors for coronary artery disease include obesity and male gender. Past treatments include ACE inhibitors. The current treatment provides mild improvement. There is no history of kidney disease, CAD/MI, CVA or heart failure.      Review of Systems  Constitutional: Negative for malaise/fatigue.  Respiratory: Negative for shortness of breath.   Cardiovascular: Negative for orthopnea.  Neurological: Negative for headaches.  All other systems reviewed and are negative.      Objective:   Physical Exam  Constitutional: He is oriented to person, place, and time. He appears well-developed and well-nourished. No distress.  HENT:  Head: Normocephalic.  Right Ear: External ear normal.  Left Ear: External ear normal.  Mouth/Throat: Oropharynx is clear and moist.  Eyes: Pupils are equal, round, and reactive to light. Right eye exhibits no discharge. Left eye exhibits no discharge.  Neck: Normal range of motion. Neck supple. No thyromegaly present.  Cardiovascular: Normal rate, regular rhythm, normal heart sounds and intact distal pulses.  No murmur heard. Pulmonary/Chest: Effort normal and breath sounds normal. No respiratory distress. He has no wheezes.  Abdominal: Soft. Bowel sounds are normal. He exhibits no distension. There is no tenderness.  Musculoskeletal:  Normal range of motion. He exhibits no edema or tenderness.  Neurological: He is alert and oriented to person, place, and time. He has normal reflexes. No cranial nerve deficit.  Skin: Skin is warm and dry. No rash noted. No erythema.  Psychiatric: He has a normal mood and affect. His behavior is normal. Judgment and thought content normal.  Vitals reviewed.     BP 135/78   Pulse 75   Temp 97.9 F (36.6 C) (Oral)   Ht '5\' 10"'$  (1.778 m)   Wt 166 lb 12.8 oz (75.7 kg)   BMI 23.93 kg/m      Assessment & Plan:  JOANDY BURGET comes in today with chief complaint of Hypertension (recheck) and Annual Exam   Diagnosis and orders addressed:  1. Annual physical exam - CMP14+EGFR - CBC with Differential/Platelet - Lipid panel - PSA, total and free - TSH  2. Essential hypertension -Will decrease Lisinopril to 10 mg from 20 mg RTO in 2 weeks  - CMP14+EGFR - CBC with Differential/Platelet - lisinopril (PRINIVIL,ZESTRIL) 10 MG tablet; Take 1 tablet (10 mg total) by mouth daily.  Dispense: 90 tablet; Refill: 3  3. Need for hepatitis C screening test - Hepatitis C antibody  4. Colon cancer screening - Cologuard  5. Screening for malignant neoplasm of the rectum - Cologuard   Labs pending Health Maintenance reviewed-TDAP given today Diet and exercise encouraged  Follow up plan: 2 weeks to recheck HTN   Evelina Dun, FNP

## 2017-09-08 NOTE — Progress Notes (Signed)
   Subjective:    Patient ID: Michael Washington, male    DOB: 02-May-1945, 72 y.o.   MRN: [id number]  HPI    Review of Systems     Objective:   Physical Exam        Assessment & Plan:

## 2017-09-08 NOTE — Patient Instructions (Signed)

## 2017-09-08 NOTE — Addendum Note (Signed)
Addended by: Almeta MonasSTONE, JANIE M on: 09/08/2017 12:24 PM   Modules accepted: Orders

## 2017-09-09 LAB — CMP14+EGFR
ALK PHOS: 59 IU/L (ref 39–117)
ALT: 13 IU/L (ref 0–44)
AST: 20 IU/L (ref 0–40)
Albumin/Globulin Ratio: 1.6 (ref 1.2–2.2)
Albumin: 4.4 g/dL (ref 3.6–4.8)
BILIRUBIN TOTAL: 0.4 mg/dL (ref 0.0–1.2)
BUN/Creatinine Ratio: 17 (ref 10–24)
BUN: 19 mg/dL (ref 8–27)
CO2: 17 mmol/L — AB (ref 20–29)
CREATININE: 1.11 mg/dL (ref 0.76–1.27)
Calcium: 9.2 mg/dL (ref 8.6–10.2)
Chloride: 105 mmol/L (ref 96–106)
GFR calc Af Amer: 78 mL/min/{1.73_m2} (ref >59)
GFR calc non Af Amer: 67 mL/min/{1.73_m2} (ref >59)
GLUCOSE: 102 mg/dL — AB (ref 65–99)
Globulin, Total: 2.7 g/dL (ref 1.5–4.5)
Potassium: 4.5 mmol/L (ref 3.5–5.2)
Sodium: 140 mmol/L (ref 134–144)
Total Protein: 7.1 g/dL (ref 6.0–8.5)

## 2017-09-09 LAB — LIPID PANEL
CHOL/HDL RATIO: 4.9 ratio (ref 0.0–5.0)
CHOLESTEROL TOTAL: 151 mg/dL (ref 100–199)
HDL: 31 mg/dL — AB (ref >39)
LDL Calculated: 81 mg/dL (ref 0–99)
TRIGLYCERIDES: 195 mg/dL — AB (ref 0–149)
VLDL Cholesterol Cal: 39 mg/dL (ref 5–40)

## 2017-09-09 LAB — CBC WITH DIFFERENTIAL/PLATELET
BASOS ABS: 0.1 10*3/uL (ref 0.0–0.2)
Basos: 1 %
EOS (ABSOLUTE): 0.3 10*3/uL (ref 0.0–0.4)
EOS: 3 %
HEMATOCRIT: 40.1 % (ref 37.5–51.0)
HEMOGLOBIN: 13.2 g/dL (ref 13.0–17.7)
IMMATURE GRANULOCYTES: 0 %
Immature Grans (Abs): 0 10*3/uL (ref 0.0–0.1)
LYMPHS ABS: 2.4 10*3/uL (ref 0.7–3.1)
Lymphs: 31 %
MCH: 31.6 pg (ref 26.6–33.0)
MCHC: 32.9 g/dL (ref 31.5–35.7)
MCV: 96 fL (ref 79–97)
MONOCYTES: 7 %
Monocytes Absolute: 0.6 10*3/uL (ref 0.1–0.9)
NEUTROS PCT: 58 %
Neutrophils Absolute: 4.5 10*3/uL (ref 1.4–7.0)
Platelets: 231 10*3/uL (ref 150–450)
RBC: 4.18 x10E6/uL (ref 4.14–5.80)
RDW: 14.2 % (ref 12.3–15.4)
WBC: 7.8 10*3/uL (ref 3.4–10.8)

## 2017-09-09 LAB — PSA, TOTAL AND FREE
PSA, Free Pct: 27.5 %
PSA, Free: 0.33 ng/mL
Prostate Specific Ag, Serum: 1.2 ng/mL (ref 0.0–4.0)

## 2017-09-09 LAB — TSH: TSH: 2.08 u[IU]/mL (ref 0.450–4.500)

## 2017-09-09 LAB — HEPATITIS C ANTIBODY: Hep C Virus Ab: <0.1 s/co ratio (ref 0.0–0.9)

## 2017-09-20 ENCOUNTER — Ambulatory Visit: Payer: Medicare HMO | Admitting: Family

## 2017-09-26 ENCOUNTER — Ambulatory Visit: Payer: Medicare PPO | Admitting: Family

## 2017-09-26 ENCOUNTER — Encounter: Payer: Self-pay | Admitting: Family

## 2017-09-26 VITALS — BP 122/73 | HR 75 | Temp 98.4°F | Ht 70.0 in | Wt 169.4 lb

## 2017-09-26 DIAGNOSIS — I10 Essential (primary) hypertension: Secondary | ICD-10-CM

## 2017-09-26 MED ORDER — LISINOPRIL 10 MG PO TABS: 10.0000 mg | ORAL_TABLET | Freq: Every day | ORAL | 3 refills | Status: DC

## 2017-09-26 NOTE — Patient Instructions (Signed)

## 2017-09-26 NOTE — Progress Notes (Signed)
   Subjective:    Patient ID: Michael Washington, male    DOB: 10/09/1945, 72 y.o.   MRN: [id number]  Chief Complaint  Patient presents with  . Hypertension    recheck    Hypertension  This is a chronic problem. The current episode started more than 1 year ago. The problem has been resolved since onset. The problem is controlled. Pertinent negatives include no headaches, malaise/fatigue, peripheral edema or shortness of breath. The current treatment provides moderate improvement. There is no history of kidney disease or CAD/MI.      Review of Systems  Constitutional: Negative for malaise/fatigue.  Respiratory: Negative for shortness of breath.   Neurological: Negative for headaches.  All other systems reviewed and are negative.      Objective:   Physical Exam  Constitutional: He is oriented to person, place, and time. He appears well-developed and well-nourished. No distress.  HENT:  Head: Normocephalic.  Eyes: Pupils are equal, round, and reactive to light. Right eye exhibits no discharge. Left eye exhibits no discharge.  Neck: Normal range of motion. Neck supple. No thyromegaly present.  Cardiovascular: Normal rate, regular rhythm, normal heart sounds and intact distal pulses.  No murmur heard. Pulmonary/Chest: Effort normal and breath sounds normal. No respiratory distress. He has no wheezes.  Abdominal: Soft. Bowel sounds are normal. He exhibits no distension. There is no tenderness.  Musculoskeletal: Normal range of motion. He exhibits no edema or tenderness.  Neurological: He is alert and oriented to person, place, and time. He has normal reflexes. No cranial nerve deficit.  Skin: Skin is warm and dry. No rash noted. No erythema.  Psychiatric: He has a normal mood and affect. His behavior is normal. Judgment and thought content normal.  Vitals reviewed.     BP 122/73   Pulse 75   Temp 98.4 F (36.9 C) (Oral)   Ht '5\' 10"'$  (1.778 m)   Wt 169 lb 6.4 oz (76.8 kg)   BMI  24.31 kg/m      Assessment & Plan:  Michael Washington comes in today with chief complaint of Hypertension (recheck)   Diagnosis and orders addressed:  1. Essential hypertension -Daily blood pressure log given with instructions on how to fill out and told to bring to next visit -Dash diet information given -Exercise encouraged - Stress Management  -Continue current meds -RTO in 1 year - lisinopril (PRINIVIL,ZESTRIL) 10 MG tablet; Take 1 tablet (10 mg total) by mouth daily.  Dispense: 90 tablet; Refill: 3 - BMP8+EGFR    Follow up plan: [place], FNP

## 2018-06-06 ENCOUNTER — Ambulatory Visit (INDEPENDENT_AMBULATORY_CARE_PROVIDER_SITE_OTHER): Payer: Medicare PPO

## 2018-06-06 ENCOUNTER — Other Ambulatory Visit: Payer: Self-pay

## 2018-06-06 VITALS — BP 122/73 | HR 75 | Temp 98.4°F | Ht 70.0 in | Wt 165.0 lb

## 2018-06-06 DIAGNOSIS — Z0000 Encounter for general adult medical examination without abnormal findings: Secondary | ICD-10-CM

## 2018-06-06 NOTE — Progress Notes (Addendum)
MEDICARE ANNUAL WELLNESS VISIT  06/06/2018  Telephone Visit Disclaimer This Medicare AWV was conducted by telephone due to national recommendations for restrictions regarding the COVID-19 Pandemic (e.g. social distancing).  I verified, using two identifiers, that I am speaking with Michael Washington or their authorized healthcare agent. I discussed the limitations, risks, security, and privacy concerns of performing an evaluation and management service by telephone and the potential availability of an in-person appointment in the future. The patient expressed understanding and agreed to proceed.   Subjective:  Michael Washington is a 73 y.o. male patient of Hawks, Edilia Bohristy A, FNP who had a Medicare Annual Wellness Visit today via telephone. 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 He lives alone. He has 5 children. he reports that he is socially active and does interact with friends/family regularly. he is moderately physically active and enjoys fishing.   Patient Care Team: Junie SpencerHawks, Christy A, FNP as PCP - General (Family Medicine)  Advanced Directives 06/06/2018  Does Patient Have a Medical Advance Directive? No  Would patient like information on creating a medical advance directive? Yes (MAU/Ambulatory/Procedural Areas - Information given)   Mailed information to patient today along with his after visit summary.  Hospital Utilization Over the Past 12 Months: # of hospitalizations or ER visits: 0 # of surgeries: 0  Review of Systems    Patient reports that his overall health is unchanged compared to last year.  Patient Reported Readings (BP, Pulse, CBG, Weight, etc) Copied vitals from last office visit  Review of Systems: History obtained from chart review  All other systems negative.  Pain Assessment Pain : No/denies pain     Current Medications & Allergies (verified) Allergies as of 06/06/2018   No Known Allergies     Medication List        Accurate as of Jun 06, 2018  9:21 AM. If you have any questions, ask your nurse or doctor.        lisinopril 10 MG tablet Commonly known as:  ZESTRIL Take 1 tablet (10 mg total) by mouth daily.   triamcinolone ointment 0.5 % Commonly known as:  KENALOG APPLY 2 TIMES A DAY.DONT USE FOR MORE THAN 10 DAYS STRAIGHT-AVOID UNDERARMS,FACE,GROIN       History (reviewed): Past Medical History:  Diagnosis Date  . Hypertension    History reviewed. No pertinent surgical history. Family History  Problem Relation Age of Onset  . Heart disease Mother   . COPD Father    Social History   Socioeconomic History  . Marital status: Legally Separated    Spouse name: Not on file  . Number of children: Not on file  . Years of education: Not on file  . Highest education level: 8th grade  Occupational History  . Occupation: Retired  Engineer, productionocial Needs  . Financial resource strain: Not hard at all  . Food insecurity:    Worry: Never true    Inability: Never true  . Transportation needs:    Medical: No    Non-medical: No  Tobacco Use  . Smoking status: Former Smoker    Last attempt to quit: 01/25/1988    Years since quitting: 30.3  . Smokeless tobacco: Current User    Types: Chew  Substance and Sexual Activity  . Alcohol use: No  . Drug use: No  . Sexual activity: Not Currently  Lifestyle  . Physical activity:    Days per week: 0 days  Minutes per session: 0 min  . Stress: Not at all  Relationships  . Social connections:    Talks on phone: More than three times a week    Gets together: More than three times a week    Attends religious service: Not on file    Active member of club or organization: Not on file    Attends meetings of clubs or organizations: Not on file    Relationship status: Not on file  Other Topics Concern  . Not on file  Social History Narrative  . Not on file    Activities of Daily Living In your present state of health, do you have any difficulty  performing the following activities: 06/06/2018  Hearing? N  Vision? Y  Comment Wears reading glasses from the [employer] at times  Difficulty concentrating or making decisions? N  Walking or climbing stairs? N  Dressing or bathing? N  Doing errands, shopping? N  Preparing Food and eating ? N  Using the Toilet? N  In the past six months, have you accidently leaked urine? N  Do you have problems with loss of bowel control? N  Managing your Medications? N  Managing your Finances? N  Housekeeping or managing your Housekeeping? N  Some recent data might be hidden    Patient Literacy How often do you need to have someone help you when you read instructions, pamphlets, or other written materials from your doctor or pharmacy?: 1 - Never What is the last grade level you completed in school?: 8th grade  Exercise Current Exercise Habits: The patient does not participate in regular exercise at present, Exercise limited by: None identified  Diet Patient reports consuming 3 meals a day and 2 snack(s) a day Patient reports that his primary diet is: Regular Patient reports that she does have regular access to food.   Depression Screen PHQ 2/9 Scores 09/26/2017 09/08/2017 02/22/2016 02/12/2016 01/05/2016 02/19/2015 12/08/2014  PHQ - 2 Score 0 0 0 0 0 0 0     Fall Risk Fall Risk  09/26/2017 09/08/2017 02/22/2016 02/12/2016 01/05/2016  Falls in the past year? No No No No No     Objective:  Michael Washington seemed alert and oriented and he participated appropriately during our telephone visit.  Blood Pressure Weight BMI  BP Readings from Last 3 Encounters:  06/06/18 122/73  09/26/17 122/73  09/08/17 135/78   Wt Readings from Last 3 Encounters:  06/06/18 165 lb (74.8 kg)  09/26/17 169 lb 6.4 oz (76.8 kg)  09/08/17 166 lb 12.8 oz (75.7 kg)   BMI Readings from Last 1 Encounters:  06/06/18 23.68 kg/m    *Unable to obtain current vital signs, weight, and BMI due to telephone visit type   Hearing/Vision  . Michael Washington did not seem to have difficulty with hearing/understanding during the telephone conversation . Reports that he has not had a formal eye exam by an eye care professional within the past year . Reports that he has not had a formal hearing evaluation within the past year *Unable to fully assess hearing and vision during telephone visit type  Cognitive Function: 6CIT Screen 06/06/2018  What Year? 0 points  What month? 0 points  What time? 0 points  Count back from 20 0 points  Months in reverse 0 points  Repeat phrase 0 points  Total Score 0    Normal Cognitive Function Screening: Yes (Normal:0-7, Significant for Dysfunction: >8)  Immunization & Health Maintenance Record Immunization History  Administered  Date(s) Administered  . Tdap 09/08/2017    Health Maintenance  Topic Date Due  . COLONOSCOPY  06/06/2019 (Originally 01/19/1998)  . PNA vac Low Risk Adult (1 of 2 - PCV13) 06/06/2019 (Originally 01/19/2013)  . INFLUENZA VACCINE  08/25/2018  . TETANUS/TDAP  09/09/2027  . Hepatitis C Screening  Completed       Assessment  This is a routine wellness examination for Michael Washington.  Health Maintenance: Due or Overdue There are no preventive care reminders to display for this patient.  Michael Washington does not need a referral for Community Assistance: Care Management:   no Social Work:    no Prescription Assistance:  no Nutrition/Diabetes Education:  no   Plan:  Personalized Goals Goals Addressed            This Visit's Progress   . DIET - EAT MORE FRUITS AND VEGETABLES      . Exercise 150 min/wk Moderate Activity        Personalized Health Maintenance & Screening Recommendations  Mailed information on Advanced Directives to patient per his request. Refuses all immunizations and a colonoscopy. Last eye exam was 2005. Also declines to have this done.  Lung Cancer Screening Recommended: no (Low Dose CT Chest recommended if Age 49-80 years,  30 pack-year currently smoking OR have quit w/in past 15 years) Hepatitis C Screening recommended: Done of 09/08/17 HIV Screening recommended: no  Advanced Directives: Written information was prepared per patient's request.  Referrals & Orders No orders of the defined types were placed in this encounter.   Follow-up Plan . Follow-up with Junie Spencer, FNP as planned . Schedule a follow up appt in the next month or so.  .    I have personally reviewed and noted the following in the patient's chart:   . Medical and social history . Use of alcohol, tobacco or illicit drugs  . Current medications and supplements . Functional ability and status . Nutritional status . Physical activity . Advanced directives . List of other physicians . Hospitalizations, surgeries, and ER visits in previous 12 months . Vitals . Screenings to include cognitive, depression, and falls . Referrals and appointments  In addition, I have reviewed and discussed with Michael Washington certain preventive protocols, quality metrics, and best practice recommendations. A written personalized care plan for preventive services as well as general preventive health recommendations is available and can be mailed to the patient at his request.      Mckinley Jewel LPN  [phone]

## 2018-06-06 NOTE — Patient Instructions (Signed)
Advance Directive    Advance directives are legal documents that let you make choices ahead of time about your health care and medical treatment in case you become unable to communicate for yourself. Advance directives are a way for you to communicate your wishes to family, friends, and health care providers. This can help convey your decisions about end-of-life care if you become unable to communicate.  Discussing and writing advance directives should happen over time rather than all at once. Advance directives can be changed depending on your situation and what you want, even after you have signed the advance directives.  If you do not have an advance directive, some states assign family decision makers to act on your behalf based on how closely you are related to them. Each state has its own laws regarding advance directives. You may want to check with your health care provider, attorney, or state representative about the laws in your state. There are different types of advance directives, such as:  · Medical power of attorney.  · Living will.  · Do not resuscitate (DNR) or do not attempt resuscitation (DNAR) order.  Health care proxy and medical power of attorney  A health care proxy, also called a health care agent, is a person who is appointed to make medical decisions for you in cases in which you are unable to make the decisions yourself. Generally, people choose someone they know well and trust to represent their preferences. Make sure to ask this person for an agreement to act as your proxy. A proxy may have to exercise judgment in the event of a medical decision for which your wishes are not known.  A medical power of attorney is a legal document that names your health care proxy. Depending on the laws in your state, after the document is written, it may also need to be:  · Signed.  · Notarized.  · Dated.  · Copied.  · Witnessed.  · Incorporated into your medical record.  You may also want to appoint  someone to manage your financial affairs in a situation in which you are unable to do so. This is called a durable power of attorney for finances. It is a separate legal document from the durable power of attorney for health care. You may choose the same person or someone different from your health care proxy to act as your agent in financial matters.  If you do not appoint a proxy, or if there is a concern that the proxy is not acting in your best interests, a court-appointed guardian may be designated to act on your behalf.  Living will  A living will is a set of instructions documenting your wishes about medical care when you cannot express them yourself. Health care providers should keep a copy of your living will in your medical record. You may want to give a copy to family members or friends. To alert caregivers in case of an emergency, you can place a card in your wallet to let them know that you have a living will and where they can find it. A living will is used if you become:  · Terminally ill.  · Incapacitated.  · Unable to communicate or make decisions.  Items to consider in your living will include:  · The use or non-use of life-sustaining equipment, such as dialysis machines and breathing machines (ventilators).  · A DNR or DNAR order, which is the instruction not to use cardiopulmonary resuscitation (CPR) if breathing or   heartbeat stops.  · The use or non-use of tube feeding.  · Withholding of food and fluids.  · Comfort (palliative) care when the goal becomes comfort rather than a cure.  · Organ and tissue donation.  A living will does not give instructions for distributing your money and property if you should pass away. It is recommended that you seek the advice of a lawyer when writing a will. Decisions about taxes, beneficiaries, and asset distribution will be legally binding. This process can relieve your family and friends of any concerns surrounding disputes or questions that may come up about  the distribution of your assets.  DNR or DNAR  A DNR or DNAR order is a request not to have CPR in the event that your heart stops beating or you stop breathing. If a DNR or DNAR order has not been made and shared, a health care provider will try to help any patient whose heart has stopped or who has stopped breathing. If you plan to have surgery, talk with your health care provider about how your DNR or DNAR order will be followed if problems occur.  Summary  · Advance directives are the legal documents that allow you to make choices ahead of time about your health care and medical treatment in case you become unable to communicate for yourself.  · The process of discussing and writing advance directives should happen over time. You can change the advance directives, even after you have signed them.  · Advance directives include DNR or DNAR orders, living wills, and designating an agent as your medical power of attorney.  This information is not intended to replace advice given to you by your health care provider. Make sure you discuss any questions you have with your health care provider.  Document Released: 04/19/2007 Document Revised: 11/30/2015 Document Reviewed: 11/30/2015  Elsevier Interactive Patient Education © 2019 [employer]

## 2019-01-11 ENCOUNTER — Other Ambulatory Visit: Payer: Self-pay | Admitting: Family

## 2019-01-11 DIAGNOSIS — I10 Essential (primary) hypertension: Secondary | ICD-10-CM

## 2019-06-11 ENCOUNTER — Ambulatory Visit (INDEPENDENT_AMBULATORY_CARE_PROVIDER_SITE_OTHER): Payer: Medicare PPO

## 2019-06-11 VITALS — BP 135/80

## 2019-06-11 DIAGNOSIS — I10 Essential (primary) hypertension: Secondary | ICD-10-CM

## 2019-06-11 DIAGNOSIS — Z0000 Encounter for general adult medical examination without abnormal findings: Secondary | ICD-10-CM | POA: Diagnosis not present

## 2019-06-11 NOTE — Patient Instructions (Addendum)
  MEDICARE ANNUAL WELLNESS VISIT Health Maintenance Summary and Written Plan of Care  [name] ,  Thank you for allowing me to perform your Medicare Annual Wellness Visit and for your ongoing commitment to your health.   Health Maintenance & Immunization History Health Maintenance  Topic Date Due  . COVID-19 Vaccine (1) Never done  . COLONOSCOPY  Never done  . PNA vac Low Risk Adult (1 of 2 - PCV13) Never done  . INFLUENZA VACCINE  08/25/2019  . TETANUS/TDAP  09/09/2027  . Hepatitis C Screening  Completed   Immunization History  Administered Date(s) Administered  . Tdap 09/08/2017    These are the patient goals that we discussed: Goals Addressed            This Visit's Progress   . Client will verbalize knowledge of self management of Hypertension as evidences by BP reading of 140/90 or less; or as defined by provider      . DIET - EAT MORE FRUITS AND VEGETABLES   On track   . Exercise 150 min/wk Moderate Activity   Not on track       This is a list of Health Maintenance Items that are overdue or due now: Health Maintenance Due  Topic Date Due  . COVID-19 Vaccine (1) Never done  . COLONOSCOPY  Never done  . PNA vac Low Risk Adult (1 of 2 - PCV13) Never done     Orders/Referrals Placed Today:  Labs for hypertension   Follow-up Plan  Thursday, June 27, 2019 with Michael Rodney, FNP. Appointment time is 8:25am.

## 2019-06-11 NOTE — Progress Notes (Addendum)
MEDICARE ANNUAL WELLNESS VISIT  06/11/2019  Telephone Visit Disclaimer This Medicare AWV was conducted by telephone due to national recommendations for restrictions regarding the COVID-19 Pandemic (e.g. social distancing).  I verified, using two identifiers, that I am speaking with Michael Washington or their authorized healthcare agent. I discussed the limitations, risks, security, and privacy concerns of performing an evaluation and management service by telephone and the potential availability of an in-person appointment in the future. The patient expressed understanding and agreed to proceed.   Subjective:  Michael Washington is a 74 y.o. male patient of Hawks, Theador Hawthorne, FNP who had a Medicare Annual Wellness Visit today via telephone. Arturo is Retired and lives alone. he has five children. he reports that he is socially active and does interact with friends/family regularly. he is minimally physically active and enjoys gardening and fishing  Patient Care Team: Sharion Balloon, FNP as PCP - General (Family Medicine)  Advanced Directives 06/11/2019 06/06/2018  Does Patient Have a Medical Advance Directive? No No  Would patient like information on creating a medical advance directive? No - Patient declined Yes (MAU/Ambulatory/Procedural Areas - Information given)    Hospital Utilization Over the Past 12 Months: # of hospitalizations or ER visits: 0 # of surgeries: 0  Review of Systems    Patient reports that his overall health is unchanged compared to last year.    Patient Reported Readings (BP- 135/80, Pulse, CBG, Weight, etc)   Pain Assessment Pain : No/denies pain     Current Medications & Allergies (verified) Allergies as of 06/11/2019   No Known Allergies      Medication List        Accurate as of Jun 11, 2019  8:41 AM. If you have any questions, ask your nurse or doctor.          STOP taking these medications    triamcinolone ointment 0.5 % Commonly known  as: KENALOG       TAKE these medications    lisinopril 10 MG tablet Commonly known as: ZESTRIL TAKE 1 TABLET BY MOUTH EVERY DAY        History (reviewed): Past Medical History:  Diagnosis Date   Hypertension    No past surgical history on file. Family History  Problem Relation Age of Onset   Heart disease Mother    COPD Father    Social History   Socioeconomic History   Marital status: Legally Separated    Spouse name: Not on file   Number of children: Not on file   Years of education: Not on file   Highest education level: 8th grade  Occupational History   Occupation: Retired  Tobacco Use   Smoking status: Former Smoker    Quit date: 01/25/1988    Years since quitting: 31.3   Smokeless tobacco: Current User    Types: Chew  Substance and Sexual Activity   Alcohol use: No   Drug use: No   Sexual activity: Not Currently  Other Topics Concern   Not on file  Social History Narrative   Not on file   Social Determinants of Health   Financial Resource Strain:    Difficulty of Paying Living Expenses:   Food Insecurity:    Worried About Charity fundraiser in the Last Year:    Arboriculturist in the Last Year:   Transportation Needs:    Film/video editor (Medical):    Lack of Transportation (Non-Medical):  Physical Activity:    Days of Exercise per Week:    Minutes of Exercise per Session:   Stress:    Feeling of Stress :   Social Connections:    Frequency of Communication with Friends and Family:    Frequency of Social Gatherings with Friends and Family:    Attends Religious Services:    Active Member of Clubs or Organizations:    Attends Archivist Meetings:    Marital Status:     Activities of Daily Living In your present state of health, do you have any difficulty performing the following activities: 06/11/2019  Hearing? N  Vision? N  Difficulty concentrating or making decisions? N  Walking or climbing stairs? N  Dressing or  bathing? N  Doing errands, shopping? N  Preparing Food and eating ? N  Using the Toilet? N  In the past six months, have you accidently leaked urine? N  Do you have problems with loss of bowel control? N  Managing your Medications? N  Managing your Finances? N  Some recent data might be hidden    Patient Education/ Literacy How often do you need to have someone help you when you read instructions, pamphlets, or other written materials from your doctor or pharmacy?: 2 - Rarely What is the last grade level you completed in school?: 8th grade  Exercise Current Exercise Habits: The patient does not participate in regular exercise at present(patient is active in is garden and yard work)  Diet Patient reports consuming 3 meals a day and 3 snack(s) a day Patient reports that his primary diet is: Low Sodium Patient reports that she does have regular access to food.   Depression Screen PHQ 2/9 Scores 06/11/2019 09/26/2017 09/08/2017 02/22/2016 02/12/2016 01/05/2016 02/19/2015  PHQ - 2 Score 0 0 0 0 0 0 0     Fall Risk Fall Risk  06/11/2019 09/26/2017 09/08/2017 02/22/2016 02/12/2016  Falls in the past year? 0 No No No No     Objective:  Michael Washington seemed alert and oriented and he participated appropriately during our telephone visit.  Blood Pressure Weight BMI  BP Readings from Last 3 Encounters:  06/11/19 135/80  06/06/18 122/73  09/26/17 122/73   Wt Readings from Last 3 Encounters:  06/06/18 165 lb (74.8 kg)  09/26/17 169 lb 6.4 oz (76.8 kg)  09/08/17 166 lb 12.8 oz (75.7 kg)   BMI Readings from Last 1 Encounters:  06/06/18 23.68 kg/m    *Unable to obtain current vital signs, weight, and BMI due to telephone visit type  Hearing/Vision  Cotton did not seem to have difficulty with hearing/understanding during the telephone conversation Reports that he has not had a formal eye exam by an eye care professional within the past year Reports that he has not had a formal hearing  evaluation within the past year *Unable to fully assess hearing and vision during telephone visit type  Cognitive Function: 6CIT Screen 06/11/2019 06/06/2018  What Year? 0 points 0 points  What month? 0 points 0 points  What time? 0 points 0 points  Count back from 20 0 points 0 points  Months in reverse 0 points 0 points  Repeat phrase 0 points 0 points  Total Score 0 0   (Normal:0-7, Significant for Dysfunction: >8)  Normal Cognitive Function Screening: Yes   Immunization & Health Maintenance Record Immunization History  Administered Date(s) Administered   Tdap 09/08/2017    Health Maintenance  Topic Date Due   COVID-19  Vaccine (1) Never done   COLONOSCOPY  Never done   PNA vac Low Risk Adult (1 of 2 - PCV13) Never done   INFLUENZA VACCINE  08/25/2019   TETANUS/TDAP  09/09/2027   Hepatitis C Screening  Completed       Assessment  This is a routine wellness examination for Michael Washington.  Health Maintenance: Due or Overdue Health Maintenance Due  Topic Date Due   COVID-19 Vaccine (1) Never done   COLONOSCOPY  Never done   PNA vac Low Risk Adult (1 of 2 - PCV13) Never done    Michael Washington does not need a referral for Community Assistance: Care Management:   no Social Work:    no Prescription Assistance:  no Nutrition/Diabetes Education:  no   Plan:  Personalized Goals Goals Addressed             This Visit's Progress    Client will verbalize knowledge of self management of Hypertension as evidences by BP reading of 140/90 or less; or as defined by provider       DIET - EAT MORE FRUITS AND VEGETABLES   On track    Exercise 150 min/wk Moderate Activity   Not on track      Personalized Health Maintenance & Screening Recommendations   Lung Cancer Screening Recommended: no (Low Dose CT Chest recommended if Age 42-80 years, 30 pack-year currently smoking OR have quit w/in past 15 years) Hepatitis C Screening recommended: no HIV Screening recommended:  no  Advanced Directives: Written information was not prepared per patient's request.  Referrals & Orders Orders Placed This Encounter  Procedures   CBC with Differential/Platelet   CMP14+EGFR   Lipid panel    Follow-up Plan Follow-up with Sharion Balloon, FNP as planned Schedule 06/27/2019    I have personally reviewed and noted the following in the patient's chart:   Medical and social history Use of alcohol, tobacco or illicit drugs  Current medications and supplements Functional ability and status Nutritional status Physical activity Advanced directives List of other physicians Hospitalizations, surgeries, and ER visits in previous 12 months Vitals Screenings to include cognitive, depression, and falls Referrals and appointments  In addition, I have reviewed and discussed with Michael Washington certain preventive protocols, quality metrics, and best practice recommendations. A written personalized care plan for preventive services as well as general preventive health recommendations is available and can be mailed to the patient at his request.      Maud Deed [facility]  2/90/2111   I have reviewed and agree with the above AWV documentation.   Evelina Dun, FNP

## 2019-06-27 ENCOUNTER — Other Ambulatory Visit: Payer: Self-pay

## 2019-06-27 ENCOUNTER — Ambulatory Visit: Payer: Medicare PPO | Admitting: Family

## 2019-06-27 ENCOUNTER — Encounter: Payer: Self-pay | Admitting: Family

## 2019-06-27 DIAGNOSIS — I10 Essential (primary) hypertension: Secondary | ICD-10-CM

## 2019-06-27 DIAGNOSIS — R7309 Other abnormal glucose: Secondary | ICD-10-CM | POA: Diagnosis not present

## 2019-06-27 LAB — CMP14+EGFR
ALT: 12 IU/L (ref 0–44)
AST: 22 IU/L (ref 0–40)
Albumin/Globulin Ratio: 1.7 (ref 1.2–2.2)
Albumin: 4.3 g/dL (ref 3.7–4.7)
Alkaline Phosphatase: 83 IU/L (ref 48–121)
BUN/Creatinine Ratio: 14 (ref 10–24)
BUN: 17 mg/dL (ref 8–27)
Bilirubin Total: 0.2 mg/dL (ref 0.0–1.2)
CO2: 21 mmol/L (ref 20–29)
Calcium: 8.9 mg/dL (ref 8.6–10.2)
Chloride: 104 mmol/L (ref 96–106)
Creatinine, Ser: 1.2 mg/dL (ref 0.76–1.27)
GFR calc Af Amer: 70 mL/min/{1.73_m2} (ref >59)
GFR calc non Af Amer: 60 mL/min/{1.73_m2} (ref >59)
Globulin, Total: 2.5 g/dL (ref 1.5–4.5)
Glucose: 127 mg/dL — ABNORMAL HIGH (ref 65–99)
Potassium: 3.8 mmol/L (ref 3.5–5.2)
Sodium: 140 mmol/L (ref 134–144)
Total Protein: 6.8 g/dL (ref 6.0–8.5)

## 2019-06-27 LAB — CBC WITH DIFFERENTIAL/PLATELET
Basophils Absolute: 0.1 10*3/uL (ref 0.0–0.2)
Basos: 1 %
EOS (ABSOLUTE): 0.4 10*3/uL (ref 0.0–0.4)
Eos: 5 %
Hematocrit: 40.8 % (ref 37.5–51.0)
Hemoglobin: 13.9 g/dL (ref 13.0–17.7)
Immature Grans (Abs): 0 10*3/uL (ref 0.0–0.1)
Immature Granulocytes: 0 %
Lymphocytes Absolute: 2.7 10*3/uL (ref 0.7–3.1)
Lymphs: 32 %
MCH: 32 pg (ref 26.6–33.0)
MCHC: 34.1 g/dL (ref 31.5–35.7)
MCV: 94 fL (ref 79–97)
Monocytes Absolute: 0.7 10*3/uL (ref 0.1–0.9)
Monocytes: 8 %
Neutrophils Absolute: 4.3 10*3/uL (ref 1.4–7.0)
Neutrophils: 54 %
Platelets: 235 10*3/uL (ref 150–450)
RBC: 4.35 x10E6/uL (ref 4.14–5.80)
RDW: 12.9 % (ref 11.6–15.4)
WBC: 8.2 10*3/uL (ref 3.4–10.8)

## 2019-06-27 LAB — LIPID PANEL
Chol/HDL Ratio: 5.2 ratio — ABNORMAL HIGH (ref 0.0–5.0)
Cholesterol, Total: 162 mg/dL (ref 100–199)
HDL: 31 mg/dL — ABNORMAL LOW (ref >39)
LDL Chol Calc (NIH): 96 mg/dL (ref 0–99)
Triglycerides: 204 mg/dL — ABNORMAL HIGH (ref 0–149)
VLDL Cholesterol Cal: 35 mg/dL (ref 5–40)

## 2019-06-27 MED ORDER — LISINOPRIL 10 MG PO TABS: 10.0000 mg | ORAL_TABLET | Freq: Every day | ORAL | 3 refills | Status: DC

## 2019-06-27 NOTE — Progress Notes (Signed)
° °  Virtual Visit via telephone Note Due to COVID-19 pandemic this visit was conducted virtually. This visit type was conducted due to national recommendations for restrictions regarding the COVID-19 Pandemic (e.g. social distancing, sheltering in place) in an effort to limit this patient's exposure and mitigate transmission in our community. All issues noted in this document were discussed and addressed.  A physical exam was not performed with this format.  I connected with Michael Washington on 06/27/19 at 8:25 AM by telephone and verified that I am speaking with the correct person using two identifiers. Michael Washington is currently located in his car and no one is currently with him  during visit. The provider, Evelina Dun, FNP is located in their office at time of visit.  I discussed the limitations, risks, security and privacy concerns of performing an evaluation and management service by telephone and the availability of in person appointments. I also discussed with the patient that there may be a patient responsible charge related to this service. The patient expressed understanding and agreed to proceed.   History and Present Illness:  Hypertension This is a chronic problem. The current episode started more than 1 year ago. The problem has been resolved since onset. The problem is controlled. Associated symptoms include malaise/fatigue. Pertinent negatives include no peripheral edema or shortness of breath. Risk factors for coronary artery disease include male gender. Past treatments include ACE inhibitors. The current treatment provides moderate improvement.      Review of Systems  Constitutional: Positive for malaise/fatigue.  Respiratory: Negative for shortness of breath.   All other systems reviewed and are negative.    Observations/Objective: No SOB or distress noted   Assessment and Plan: Michael Washington comes in today with chief complaint of No chief complaint on  file.   Diagnosis and orders addressed:  1. Essential hypertension - CMP14+EGFR - lisinopril (ZESTRIL) 10 MG tablet; Take 1 tablet (10 mg total) by mouth daily.  Dispense: 90 tablet; Refill: 3 - CBC with Differential/Platelet - Lipid panel   Labs pending Health Maintenance reviewed Diet and exercise encouraged     I discussed the assessment and treatment plan with the patient. The patient was provided an opportunity to ask questions and all were answered. The patient agreed with the plan and demonstrated an understanding of the instructions.   The patient was advised to call back or seek an in-person evaluation if the symptoms worsen or if the condition fails to improve as anticipated.  The above assessment and management plan was discussed with the patient. The patient verbalized understanding of and has agreed to the management plan. Patient is aware to call the clinic if symptoms persist or worsen. Patient is aware when to return to the clinic for a follow-up visit. Patient educated on when it is appropriate to go to the emergency department.   Time call ended:  8:36 AM  I provided 11 minutes of non-face-to-face time during this encounter.    Evelina Dun, FNP

## 2019-06-28 ENCOUNTER — Other Ambulatory Visit: Payer: Self-pay | Admitting: Family

## 2019-06-28 MED ORDER — ATORVASTATIN CALCIUM 20 MG PO TABS
20.0000 mg | ORAL_TABLET | Freq: Every day | ORAL | 3 refills | Status: DC
Start: 2019-06-28 — End: 2020-07-15

## 2019-07-01 LAB — HGB A1C W/O EAG: Hgb A1c MFr Bld: 5.8 % — ABNORMAL HIGH (ref 4.8–5.6)

## 2019-07-01 LAB — SPECIMEN STATUS REPORT

## 2020-06-11 ENCOUNTER — Ambulatory Visit (INDEPENDENT_AMBULATORY_CARE_PROVIDER_SITE_OTHER): Payer: Medicare Other

## 2020-06-11 VITALS — Ht 70.0 in | Wt 165.0 lb

## 2020-06-11 DIAGNOSIS — Z0000 Encounter for general adult medical examination without abnormal findings: Secondary | ICD-10-CM

## 2020-06-11 NOTE — Patient Instructions (Signed)
Michael Washington , Thank you for taking time to come for your Medicare Wellness Visit. I appreciate your ongoing commitment to your health goals. Please review the following plan we discussed and let me know if I can assist you in the future.   Screening recommendations/referrals: Colonoscopy: Declined - return Cologuard test asap Recommended yearly ophthalmology/optometry visit for glaucoma screening and checkup Recommended yearly dental visit for hygiene and checkup  Vaccinations: Influenza vaccine: Declined Pneumococcal vaccine: Declined Tdap vaccine: Done 09/08/2017 - Repeat every 10 years Shingles vaccine: Declined   Covid-19: Declined  Advanced directives: Advance directive discussed with you today. I have provided a copy for you to complete at home and have notarized. Once this is complete please bring a copy in to our office so we can scan it into your chart.  Conditions/risks identified: Aim for 30 minutes of exercise or brisk walking each day, drink 6-8 glasses of water and eat lots of fruits and vegetables.  Next appointment: Follow up in one year for your annual wellness visit.   Preventive Care 34 Years and Older, Male  Preventive care refers to lifestyle choices and visits with your health care provider that can promote health and wellness. What does preventive care include?  A yearly physical exam. This is also called an annual well check.  Dental exams once or twice a year.  Routine eye exams. Ask your health care provider how often you should have your eyes checked.  Personal lifestyle choices, including:  Daily care of your teeth and gums.  Regular physical activity.  Eating a healthy diet.  Avoiding tobacco and drug use.  Limiting alcohol use.  Practicing safe sex.  Taking low doses of aspirin every day.  Taking vitamin and mineral supplements as recommended by your health care provider. What happens during an annual well check? The services and screenings  done by your health care provider during your annual well check will depend on your age, overall health, lifestyle risk factors, and family history of disease. Counseling  Your health care provider may ask you questions about your:  Alcohol use.  Tobacco use.  Drug use.  Emotional well-being.  Home and relationship well-being.  Sexual activity.  Eating habits.  History of falls.  Memory and ability to understand (cognition).  Work and work Astronomer. Screening  You may have the following tests or measurements:  Height, weight, and BMI.  Blood pressure.  Lipid and cholesterol levels. These may be checked every 5 years, or more frequently if you are over 73 years old.  Skin check.  Lung cancer screening. You may have this screening every year starting at age 51 if you have a 30-pack-year history of smoking and currently smoke or have quit within the past 15 years.  Fecal occult blood test (FOBT) of the stool. You may have this test every year starting at age 64.  Flexible sigmoidoscopy or colonoscopy. You may have a sigmoidoscopy every 5 years or a colonoscopy every 10 years starting at age 29.  Prostate cancer screening. Recommendations will vary depending on your family history and other risks.  Hepatitis C blood test.  Hepatitis B blood test.  Sexually transmitted disease (STD) testing.  Diabetes screening. This is done by checking your blood sugar (glucose) after you have not eaten for a while (fasting). You may have this done every 1-3 years.  Abdominal aortic aneurysm (AAA) screening. You may need this if you are a current or former smoker.  Osteoporosis. You may be screened starting  at age 59 if you are at high risk. Talk with your health care provider about your test results, treatment options, and if necessary, the need for more tests. Vaccines  Your health care provider may recommend certain vaccines, such as:  Influenza vaccine. This is recommended  every year.  Tetanus, diphtheria, and acellular pertussis (Tdap, Td) vaccine. You may need a Td booster every 10 years.  Zoster vaccine. You may need this after age 71.  Pneumococcal 13-valent conjugate (PCV13) vaccine. One dose is recommended after age 61.  Pneumococcal polysaccharide (PPSV23) vaccine. One dose is recommended after age 8. Talk to your health care provider about which screenings and vaccines you need and how often you need them. This information is not intended to replace advice given to you by your health care provider. Make sure you discuss any questions you have with your health care provider. Document Released: 02/06/2015 Document Revised: 09/30/2015 Document Reviewed: 11/11/2014 Elsevier Interactive Patient Education  2017 [place] Prevention in the Home Falls can cause injuries. They can happen to people of all ages. There are many things you can do to make your home safe and to help prevent falls. What can I do on the outside of my home?  Regularly fix the edges of walkways and driveways and fix any cracks.  Remove anything that might make you trip as you walk through a door, such as a raised step or threshold.  Trim any bushes or trees on the path to your home.  Use bright outdoor lighting.  Clear any walking paths of anything that might make someone trip, such as rocks or tools.  Regularly check to see if handrails are loose or broken. Make sure that both sides of any steps have handrails.  Any raised decks and porches should have guardrails on the edges.  Have any leaves, snow, or ice cleared regularly.  Use sand or salt on walking paths during winter.  Clean up any spills in your garage right away. This includes oil or grease spills. What can I do in the bathroom?  Use night lights.  Install grab bars by the toilet and in the tub and shower. Do not use towel bars as grab bars.  Use non-skid mats or decals in the tub or shower.  If  you need to sit down in the shower, use a plastic, non-slip stool.  Keep the floor dry. Clean up any water that spills on the floor as soon as it happens.  Remove soap buildup in the tub or shower regularly.  Attach bath mats securely with double-sided non-slip rug tape.  Do not have throw rugs and other things on the floor that can make you trip. What can I do in the bedroom?  Use night lights.  Make sure that you have a light by your bed that is easy to reach.  Do not use any sheets or blankets that are too big for your bed. They should not hang down onto the floor.  Have a firm chair that has side arms. You can use this for support while you get dressed.  Do not have throw rugs and other things on the floor that can make you trip. What can I do in the kitchen?  Clean up any spills right away.  Avoid walking on wet floors.  Keep items that you use a lot in easy-to-reach places.  If you need to reach something above you, use a strong step stool that has a grab bar.  Keep electrical cords out of the way.  Do not use floor polish or wax that makes floors slippery. If you must use wax, use non-skid floor wax.  Do not have throw rugs and other things on the floor that can make you trip. What can I do with my stairs?  Do not leave any items on the stairs.  Make sure that there are handrails on both sides of the stairs and use them. Fix handrails that are broken or loose. Make sure that handrails are as long as the stairways.  Check any carpeting to make sure that it is firmly attached to the stairs. Fix any carpet that is loose or worn.  Avoid having throw rugs at the top or bottom of the stairs. If you do have throw rugs, attach them to the floor with carpet tape.  Make sure that you have a light switch at the top of the stairs and the bottom of the stairs. If you do not have them, ask someone to add them for you. What else can I do to help prevent falls?  Wear shoes  that:  Do not have high heels.  Have rubber bottoms.  Are comfortable and fit you well.  Are closed at the toe. Do not wear sandals.  If you use a stepladder:  Make sure that it is fully opened. Do not climb a closed stepladder.  Make sure that both sides of the stepladder are locked into place.  Ask someone to hold it for you, if possible.  Clearly mark and make sure that you can see:  Any grab bars or handrails.  First and last steps.  Where the edge of each step is.  Use tools that help you move around (mobility aids) if they are needed. These include:  Canes.  Walkers.  Scooters.  Crutches.  Turn on the lights when you go into a dark area. Replace any light bulbs as soon as they burn out.  Set up your furniture so you have a clear path. Avoid moving your furniture around.  If any of your floors are uneven, fix them.  If there are any pets around you, be aware of where they are.  Review your medicines with your doctor. Some medicines can make you feel dizzy. This can increase your chance of falling. Ask your doctor what other things that you can do to help prevent falls. This information is not intended to replace advice given to you by your health care provider. Make sure you discuss any questions you have with your health care provider. Document Released: 11/06/2008 Document Revised: 06/18/2015 Document Reviewed: 02/14/2014 Elsevier Interactive Patient Education  2017 [employer].

## 2020-06-11 NOTE — Progress Notes (Signed)
Subjective:   Michael Washington is a 75 y.o. male who presents for Medicare Annual/Subsequent preventive examination.  Virtual Visit via Telephone Note  I connected with  Michael Washington on 06/11/20 at  8:15 AM EDT by telephone and verified that I am speaking with the correct person using two identifiers.  Location: Patient: Home Provider: [facility] Persons participating in the virtual visit: patient/Nurse Health Advisor   I discussed the limitations, risks, security and privacy concerns of performing an evaluation and management service by telephone and the availability of in person appointments. The patient expressed understanding and agreed to proceed.  Interactive audio and video telecommunications were attempted between this nurse and patient, however failed, due to patient having technical difficulties OR patient did not have access to video capability.  We continued and completed visit with audio only.  Some vital signs may be absent or patient reported.   Shawnique Mariotti E Sairah Knobloch, LPN   Review of Systems     Cardiac Risk Factors include: advanced age (>70men, >28 women);dyslipidemia;hypertension;male gender     Objective:    Today's Vitals   06/11/20 0812  Weight: 165 lb (74.8 kg)  Height: 5\' 10"  (1.778 m)   Body mass index is 23.68 kg/m.  Advanced Directives 06/11/2020 06/11/2019 06/06/2018  Does Patient Have a Medical Advance Directive? No No No  Would patient like information on creating a medical advance directive? Yes (MAU/Ambulatory/Procedural Areas - Information given) No - Patient declined Yes (MAU/Ambulatory/Procedural Areas - Information given)    Current Medications (verified) Outpatient Encounter Medications as of 06/11/2020  Medication Sig  . atorvastatin (LIPITOR) 20 MG tablet Take 1 tablet (20 mg total) by mouth daily.  06/13/2020 lisinopril (ZESTRIL) 10 MG tablet Take 1 tablet (10 mg total) by mouth daily.   No facility-administered encounter medications on file as of  06/11/2020.    Allergies (verified) Patient has no known allergies.   History: Past Medical History:  Diagnosis Date  . Hypertension    History reviewed. No pertinent surgical history. Family History  Problem Relation Age of Onset  . Heart disease Mother   . COPD Father    Social History   Socioeconomic History  . Marital status: Legally Separated    Spouse name: Not on file  . Number of children: Not on file  . Years of education: Not on file  . Highest education level: 8th grade  Occupational History  . Occupation: Retired  Tobacco Use  . Smoking status: Former Smoker    Quit date: 01/25/1988    Years since quitting: 32.4  . Smokeless tobacco: Current User    Types: Chew  Substance and Sexual Activity  . Alcohol use: No  . Drug use: No  . Sexual activity: Not Currently  Other Topics Concern  . Not on file  Social History Narrative   Lives alone. Family nearby. Talks to son daily. 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 Difficulty of Paying Living Expenses: Not hard at all  Food Insecurity: No Food Insecurity  . Worried About Corporate investment banker in the Last Year: Never true  . Ran Out of Food in the Last Year: Never true  Transportation Needs: No Transportation Needs  . Lack of Transportation (Medical): No  . Lack of Transportation (Non-Medical): No  Physical Activity: Sufficiently Active  . Days of Exercise per Week: 7 days  . Minutes of Exercise  per Session: 60 min  Stress: No Stress Concern Present  . Feeling of Stress : Not at all  Social Connections: Socially Isolated  . Frequency of Communication with Friends and Family: More than three times a week  . Frequency of Social Gatherings with Friends and Family: Once a week  . Attends Religious Services: Never  . Active Member of Clubs or Organizations: No  . Attends Banker Meetings: Never  . Marital Status:  Separated    Tobacco Counseling Ready to quit: Not Answered Counseling given: Not Answered   Clinical Intake:  Pre-visit preparation completed: Yes  Pain : No/denies pain     BMI - recorded: 23.68 Nutritional Status: BMI of 19-24  Normal Nutritional Risks: None Diabetes: No  How often do you need to have someone help you when you read instructions, pamphlets, or other written materials from your doctor or pharmacy?: 1 - Never  Diabetic? No  Interpreter Needed?: No  Information entered by :: Alon Mazor, LPN   Activities of Daily Living In your present state of health, do you have any difficulty performing the following activities: 06/11/2020  Hearing? N  Vision? N  Difficulty concentrating or making decisions? N  Walking or climbing stairs? N  Dressing or bathing? N  Doing errands, shopping? N  Preparing Food and eating ? N  Using the Toilet? N  In the past six months, have you accidently leaked urine? N  Do you have problems with loss of bowel control? N  Managing your Medications? N  Managing your Finances? N  Housekeeping or managing your Housekeeping? N  Some recent data might be hidden    Patient Care Team: Junie Spencer, FNP as PCP - General (Family Medicine)  Indicate any recent Medical Services you may have received from other than [facility] providers in the past year (date may be approximate).     Assessment:   This is a routine wellness examination for Michael Washington.  Hearing/Vision screen  Hearing Screening   125Hz  250Hz  500Hz  1000Hz  2000Hz  3000Hz  4000Hz  6000Hz  8000Hz   Right ear:           Left ear:           Comments: Denies hearing difficulties   Vision Screening Comments: Denies hearing difficulties  Dietary issues and exercise activities discussed: Current Exercise Habits: Home exercise routine, Type of exercise: walking;Other - see comments (farm work and gardening), Time (Minutes): 60, Frequency (Times/Week): 7, Weekly Exercise (Minutes/Week):  420, Intensity: Mild, Exercise limited by: None identified  Goals Addressed            This Visit's Progress   . Client will verbalize knowledge of self management of Hypertension as evidences by BP reading of 140/90 or less; or as defined by provider   On track   . DIET - EAT MORE FRUITS AND VEGETABLES   On track   . Exercise 150 min/wk Moderate Activity   On track     Depression Screen PHQ 2/9 Scores 06/11/2020 06/11/2019 09/26/2017 09/08/2017 02/22/2016 02/12/2016 01/05/2016  PHQ - 2 Score 0 0 0 0 0 0 0    Fall Risk Fall Risk  06/11/2020 06/11/2019 09/26/2017 09/08/2017 02/22/2016  Falls in the past year? 0 0 No No No  Number falls in past yr: 0 - - - -  Injury with Fall? 0 - - - -  Risk for fall due to : No Fall Risks - - - -  Follow up Falls prevention discussed - - - -  FALL RISK PREVENTION PERTAINING TO THE HOME:  Any stairs in or around the home? No  If so, are there any without handrails? No  Home free of loose throw rugs in walkways, pet beds, electrical cords, etc? Yes  Adequate lighting in your home to reduce risk of falls? Yes   ASSISTIVE DEVICES UTILIZED TO PREVENT FALLS:  Life alert? No  Use of a cane, walker or w/c? No  Grab bars in the bathroom? No  Shower chair or bench in shower? No  Elevated toilet seat or a handicapped toilet? No   TIMED UP AND GO:  Was the test performed? No . Telephonic visit  Cognitive Function:     6CIT Screen 06/11/2019 06/06/2018  What Year? 0 points 0 points  What month? 0 points 0 points  What time? 0 points 0 points  Count back from 20 0 points 0 points  Months in reverse 0 points 0 points  Repeat phrase 0 points 0 points  Total Score 0 0    Immunizations Immunization History  Administered Date(s) Administered  . Tdap 09/08/2017    TDAP status: Up to date  Flu Vaccine status: Due, Education has been provided regarding the importance of this vaccine. Advised may receive this vaccine at local pharmacy or Health Dept.  Aware to provide a copy of the vaccination record if obtained from local pharmacy or Health Dept. Verbalized acceptance and understanding.  Pneumococcal vaccine status: Due, Education has been provided regarding the importance of this vaccine. Advised may receive this vaccine at local pharmacy or Health Dept. Aware to provide a copy of the vaccination record if obtained from local pharmacy or Health Dept. Verbalized acceptance and understanding.  Covid-19 vaccine status: Declined, Education has been provided regarding the importance of this vaccine but patient still declined. Advised may receive this vaccine at local pharmacy or Health Dept.or vaccine clinic. Aware to provide a copy of the vaccination record if obtained from local pharmacy or Health Dept. Verbalized acceptance and understanding.  Qualifies for Shingles Vaccine? Yes   Zostavax completed No   Shingrix Completed?: No.    Education has been provided regarding the importance of this vaccine. Patient has been advised to call insurance company to determine out of pocket expense if they have not yet received this vaccine. Advised may also receive vaccine at local pharmacy or Health Dept. Verbalized acceptance and understanding.  Screening Tests Health Maintenance  Topic Date Due  . COVID-19 Vaccine (1) Never done  . COLONOSCOPY (Pts 45-39yrs Insurance coverage will need to be confirmed)  Never done  . PNA vac Low Risk Adult (1 of 2 - PCV13) Never done  . INFLUENZA VACCINE  08/24/2020  . TETANUS/TDAP  09/09/2027  . Hepatitis C Screening  Completed  . HPV VACCINES  Aged Out    Health Maintenance  Health Maintenance Due  Topic Date Due  . COVID-19 Vaccine (1) Never done  . COLONOSCOPY (Pts 45-73yrs Insurance coverage will need to be confirmed)  Never done  . PNA vac Low Risk Adult (1 of 2 - PCV13) Never done    Colorectal cancer screening: Patient declines; he does have a cologuard test at home he hasn't returned yet - advised to  be sure it isn't expired and return asap  Lung Cancer Screening: (Low Dose CT Chest recommended if Age 31-80 years, 30 pack-year currently smoking OR have quit w/in 15years.) does not qualify.   Additional Screening:  Hepatitis C Screening: does qualify; Completed 09/08/2017  Vision Screening:  Recommended annual ophthalmology exams for early detection of glaucoma and other disorders of the eye. Is the patient up to date with their annual eye exam?  No  Who is the provider or what is the name of the office in which the patient attends annual eye exams? n/a If pt is not established with a provider, would they like to be referred to a provider to establish care? No .   Dental Screening: Recommended annual dental exams for proper oral hygiene  Community Resource Referral / Chronic Care Management: CRR required this visit?  No   CCM required this visit?  No      Plan:     I have personally reviewed and noted the following in the patient's chart:   . Medical and social history . Use of alcohol, tobacco or illicit drugs  . Current medications and supplements including opioid prescriptions. Patient is not currently taking opioid prescriptions. . Functional ability and status . Nutritional status . Physical activity . Advanced directives . List of other physicians . Hospitalizations, surgeries, and ER visits in previous 12 months . Vitals . Screenings to include cognitive, depression, and falls . Referrals and appointments  In addition, I have reviewed and discussed with patient certain preventive protocols, quality metrics, and best practice recommendations. A written personalized care plan for preventive services as well as general preventive health recommendations were provided to patient.     [place] Constablemy E Won Kreuzer, LPN   1/61/09605/19/2022   Nurse Notes: None

## 2020-06-16 ENCOUNTER — Ambulatory Visit (INDEPENDENT_AMBULATORY_CARE_PROVIDER_SITE_OTHER): Payer: Medicare Other | Admitting: Family

## 2020-06-16 ENCOUNTER — Other Ambulatory Visit: Payer: Self-pay

## 2020-06-16 ENCOUNTER — Encounter: Payer: Self-pay | Admitting: Family

## 2020-06-16 VITALS — BP 179/84 | HR 69 | Temp 97.4°F | Ht 70.0 in | Wt 164.6 lb

## 2020-06-16 DIAGNOSIS — R7303 Prediabetes: Secondary | ICD-10-CM | POA: Insufficient documentation

## 2020-06-16 DIAGNOSIS — Z0001 Encounter for general adult medical examination with abnormal findings: Secondary | ICD-10-CM | POA: Diagnosis not present

## 2020-06-16 DIAGNOSIS — I10 Essential (primary) hypertension: Secondary | ICD-10-CM

## 2020-06-16 DIAGNOSIS — Z1211 Encounter for screening for malignant neoplasm of colon: Secondary | ICD-10-CM | POA: Diagnosis not present

## 2020-06-16 DIAGNOSIS — Z0000 Encounter for general adult medical examination without abnormal findings: Secondary | ICD-10-CM

## 2020-06-16 DIAGNOSIS — E785 Hyperlipidemia, unspecified: Secondary | ICD-10-CM | POA: Diagnosis not present

## 2020-06-16 LAB — BAYER DCA HB A1C WAIVED: HB A1C (BAYER DCA - WAIVED): 5.8 % (ref <7.0)

## 2020-06-16 NOTE — Patient Instructions (Signed)

## 2020-06-16 NOTE — Progress Notes (Signed)
Subjective:    Patient ID: Michael Washington, male    DOB: 1945/07/12, 75 y.o.   MRN: [id number]  Chief Complaint  Patient presents with  . Medical Management of Chronic Issues  . Hypertension   Pt presents to the office today for CPE and  chronic follow up. He reports his BP gets low at home and does not take his medication. States he took a 1/4 of a pill this morning. His BP is 179/84.  Hypertension This is a chronic problem. The current episode started more than 1 year ago. The problem has been waxing and waning since onset. The problem is uncontrolled. Pertinent negatives include no blurred vision, malaise/fatigue, peripheral edema or shortness of breath. Risk factors for coronary artery disease include dyslipidemia, male gender and sedentary lifestyle. Past treatments include ACE inhibitors. There is no history of CVA or heart failure.  Hyperlipidemia This is a chronic problem. The current episode started more than 1 year ago. The problem is controlled. Recent lipid tests were reviewed and are normal. Pertinent negatives include no shortness of breath. Current antihyperlipidemic treatment includes statins. The current treatment provides moderate improvement of lipids. Risk factors for coronary artery disease include dyslipidemia, male sex, hypertension and a sedentary lifestyle.  Diabetes He presents for his follow-up diabetic visit. He has type 2 diabetes mellitus. His disease course has been stable. Pertinent negatives for diabetes include no blurred vision and no foot paresthesias. Pertinent negatives for diabetic complications include no CVA. (Does not check BS at home)      Review of Systems  Constitutional: Negative for malaise/fatigue.  Eyes: Negative for blurred vision.  Respiratory: Negative for shortness of breath.   All other systems reviewed and are negative.  Family History  Problem Relation Age of Onset  . Heart disease Mother   . COPD Father    Social History    Socioeconomic History  . Marital status: Legally Separated    Spouse name: Not on file  . Number of children: Not on file  . Years of education: Not on file  . Highest education level: 8th grade  Occupational History  . Occupation: Retired  Tobacco Use  . Smoking status: Former Smoker    Quit date: 01/25/1988    Years since quitting: 32.4  . Smokeless tobacco: Current User    Types: Chew  Substance and Sexual Activity  . Alcohol use: No  . Drug use: No  . Sexual activity: Not Currently  Other Topics Concern  . Not on file  Social History Narrative   Lives alone. Family nearby. Talks to son daily. 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 Difficulty of Paying Living Expenses: Not hard at all  Food Insecurity: No Food Insecurity  . Worried About Programme researcher, broadcasting/film/video in the Last Year: Never true  . Ran Out of Food in the Last Year: Never true  Transportation Needs: No Transportation Needs  . Lack of Transportation (Medical): No  . Lack of Transportation (Non-Medical): No  Physical Activity: Sufficiently Active  . Days of Exercise per Week: 7 days  . Minutes of Exercise per Session: 60 min  Stress: No Stress Concern Present  . Feeling of Stress : Not at all  Social Connections: Socially Isolated  . Frequency of Communication with Friends and Family: More than three times a week  . Frequency of Social Gatherings with Friends and Family:  Once a week  . Attends Religious Services: Never  . Active Member of Clubs or Organizations: No  . Attends Archivist Meetings: Never  . Marital Status: Separated       Objective:   Physical Exam Vitals reviewed.  Constitutional:      General: He is not in acute distress.    Appearance: He is well-developed.  HENT:     Head: Normocephalic.     Right Ear: Tympanic membrane normal.     Left Ear: Tympanic membrane normal.  Eyes:     General:         Right eye: No discharge.        Left eye: No discharge.     Pupils: Pupils are equal, round, and reactive to light.  Neck:     Thyroid: No thyromegaly.  Cardiovascular:     Rate and Rhythm: Normal rate and regular rhythm.     Heart sounds: Normal heart sounds. No murmur heard.   Pulmonary:     Effort: Pulmonary effort is normal. No respiratory distress.     Breath sounds: Normal breath sounds. No wheezing.  Abdominal:     General: Bowel sounds are normal. There is no distension.     Palpations: Abdomen is soft.     Tenderness: There is no abdominal tenderness.  Musculoskeletal:        General: No tenderness. Normal range of motion.     Cervical back: Normal range of motion and neck supple.  Skin:    General: Skin is warm and dry.     Findings: No erythema or rash.  Neurological:     Mental Status: He is alert and oriented to person, place, and time.     Cranial Nerves: No cranial nerve deficit.     Deep Tendon Reflexes: Reflexes are normal and symmetric.  Psychiatric:        Behavior: Behavior normal.        Thought Content: Thought content normal.        Judgment: Judgment normal.      BP (!) 179/84   Pulse 69   Temp (!) 97.4 F (36.3 C) (Temporal)   Ht $R'5\' 10"'ay$  (1.778 m)   Wt 164 lb 9.6 oz (74.7 kg)   BMI 23.62 kg/m       Assessment & Plan:  Michael Washington comes in today with chief complaint of Medical Management of Chronic Issues and Hypertension   Diagnosis and orders addressed:  1. Annual physical exam - Cologuard - CMP14+EGFR - CBC with Differential/Platelet - Lipid panel - TSH - Bayer DCA Hb A1c Waived  2. Essential hypertension Pt will restart taking his lisinopril 10 mg daily!!!!  RTO in 1-2 weeks for nurse visit to recheck HTN. If not at goal will need to change medications. Discussed importance of taking daily.  -Daily blood pressure log given with instructions on how to fill out and told to bring to next visit -Dash diet information  given -Exercise encouraged - Stress Management  -Continue current meds - CMP14+EGFR - CBC with Differential/Platelet  3. Hyperlipidemia, unspecified hyperlipidemia type - CMP14+EGFR - CBC with Differential/Platelet - Lipid panel  4. Prediabetes - CMP14+EGFR - CBC with Differential/Platelet - Bayer DCA Hb A1c Waived  5. Colon cancer screening - Cologuard - CMP14+EGFR - CBC with Differential/Platelet   Labs pending Health Maintenance reviewed Diet and exercise encouraged  Follow up plan: 2 weeks for nurse visit, 6 months for chronic follow up   [facility]  Lenna Gilford, [place]

## 2020-06-17 LAB — CBC WITH DIFFERENTIAL/PLATELET
Basophils Absolute: 0.1 10*3/uL (ref 0.0–0.2)
Basos: 1 %
EOS (ABSOLUTE): 0.3 10*3/uL (ref 0.0–0.4)
Eos: 4 %
Hematocrit: 42.5 % (ref 37.5–51.0)
Hemoglobin: 14.8 g/dL (ref 13.0–17.7)
Immature Grans (Abs): 0 10*3/uL (ref 0.0–0.1)
Immature Granulocytes: 0 %
Lymphocytes Absolute: 2.1 10*3/uL (ref 0.7–3.1)
Lymphs: 27 %
MCH: 32.2 pg (ref 26.6–33.0)
MCHC: 34.8 g/dL (ref 31.5–35.7)
MCV: 93 fL (ref 79–97)
Monocytes Absolute: 0.6 10*3/uL (ref 0.1–0.9)
Monocytes: 8 %
Neutrophils Absolute: 4.5 10*3/uL (ref 1.4–7.0)
Neutrophils: 60 %
Platelets: 222 10*3/uL (ref 150–450)
RBC: 4.59 x10E6/uL (ref 4.14–5.80)
RDW: 11.9 % (ref 11.6–15.4)
WBC: 7.5 10*3/uL (ref 3.4–10.8)

## 2020-06-17 LAB — CMP14+EGFR
ALT: 16 IU/L (ref 0–44)
AST: 24 IU/L (ref 0–40)
Albumin/Globulin Ratio: 1.7 (ref 1.2–2.2)
Albumin: 4.4 g/dL (ref 3.7–4.7)
Alkaline Phosphatase: 88 IU/L (ref 44–121)
BUN/Creatinine Ratio: 13 (ref 10–24)
BUN: 13 mg/dL (ref 8–27)
Bilirubin Total: 0.4 mg/dL (ref 0.0–1.2)
CO2: 22 mmol/L (ref 20–29)
Calcium: 9.2 mg/dL (ref 8.6–10.2)
Chloride: 102 mmol/L (ref 96–106)
Creatinine, Ser: 0.97 mg/dL (ref 0.76–1.27)
Globulin, Total: 2.6 g/dL (ref 1.5–4.5)
Glucose: 139 mg/dL — ABNORMAL HIGH (ref 65–99)
Potassium: 4.4 mmol/L (ref 3.5–5.2)
Sodium: 140 mmol/L (ref 134–144)
Total Protein: 7 g/dL (ref 6.0–8.5)
eGFR: 83 mL/min/{1.73_m2} (ref >59)

## 2020-06-17 LAB — LIPID PANEL
Chol/HDL Ratio: 2.7 ratio (ref 0.0–5.0)
Cholesterol, Total: 102 mg/dL (ref 100–199)
HDL: 38 mg/dL — ABNORMAL LOW (ref >39)
LDL Chol Calc (NIH): 48 mg/dL (ref 0–99)
Triglycerides: 81 mg/dL (ref 0–149)
VLDL Cholesterol Cal: 16 mg/dL (ref 5–40)

## 2020-06-17 LAB — TSH: TSH: 2.65 u[IU]/mL (ref 0.450–4.500)

## 2020-07-15 ENCOUNTER — Other Ambulatory Visit: Payer: Self-pay | Admitting: Family

## 2020-08-02 ENCOUNTER — Other Ambulatory Visit: Payer: Self-pay | Admitting: Family

## 2020-08-02 DIAGNOSIS — I10 Essential (primary) hypertension: Secondary | ICD-10-CM

## 2020-08-14 ENCOUNTER — Encounter: Payer: Self-pay | Admitting: Family Medicine

## 2021-01-13 ENCOUNTER — Other Ambulatory Visit: Payer: Self-pay | Admitting: Family

## 2021-01-23 ENCOUNTER — Other Ambulatory Visit: Payer: Self-pay | Admitting: Family

## 2021-01-23 DIAGNOSIS — I10 Essential (primary) hypertension: Secondary | ICD-10-CM

## 2021-02-17 ENCOUNTER — Other Ambulatory Visit: Payer: Self-pay | Admitting: Family

## 2021-02-17 NOTE — Telephone Encounter (Signed)
[place]. NTBS 30 days given 01/13/21

## 2021-02-18 NOTE — Telephone Encounter (Signed)
Pt made appt for 03/25/2021, offered sooner appt, he declined stated he would have enough until then since he does not take this medication everyday

## 2021-03-25 ENCOUNTER — Ambulatory Visit (INDEPENDENT_AMBULATORY_CARE_PROVIDER_SITE_OTHER): Payer: Medicare Other | Admitting: Family

## 2021-03-25 ENCOUNTER — Encounter: Payer: Self-pay | Admitting: Family

## 2021-03-25 VITALS — BP 121/67 | HR 72 | Temp 98.4°F | Ht 70.0 in | Wt 161.8 lb

## 2021-03-25 DIAGNOSIS — Z0000 Encounter for general adult medical examination without abnormal findings: Secondary | ICD-10-CM

## 2021-03-25 DIAGNOSIS — R7303 Prediabetes: Secondary | ICD-10-CM

## 2021-03-25 DIAGNOSIS — I10 Essential (primary) hypertension: Secondary | ICD-10-CM | POA: Diagnosis not present

## 2021-03-25 DIAGNOSIS — E785 Hyperlipidemia, unspecified: Secondary | ICD-10-CM

## 2021-03-25 DIAGNOSIS — Z0001 Encounter for general adult medical examination with abnormal findings: Secondary | ICD-10-CM | POA: Diagnosis not present

## 2021-03-25 LAB — BAYER DCA HB A1C WAIVED: HB A1C (BAYER DCA - WAIVED): 5.8 % — ABNORMAL HIGH (ref 4.8–5.6)

## 2021-03-25 MED ORDER — LISINOPRIL 5 MG PO TABS: 5.0000 mg | ORAL_TABLET | Freq: Every day | ORAL | 3 refills | Status: AC

## 2021-03-25 MED ORDER — ATORVASTATIN CALCIUM 20 MG PO TABS: 20.0000 mg | ORAL_TABLET | Freq: Every day | ORAL | 3 refills | Status: AC

## 2021-03-25 NOTE — Progress Notes (Addendum)
? ?Subjective:  ? ? Patient ID: Michael Washington, male    DOB: [birth year], 76 y.o.   MRN: [id number] ? ?Chief Complaint  ?Patient presents with  ? Medical Management of Chronic Issues  ? ?PT presents to the office today for CPE. He states he only takes his half of his Lisinopril 10 mg because if not it will "get too low". His BP today is elevated.  ? ?He states his BP at home is running 121/67.  ?Hypertension ?This is a chronic problem. The current episode started more than 1 year ago. The problem has been waxing and waning since onset. The problem is uncontrolled. Associated symptoms include malaise/fatigue. Pertinent negatives include no blurred vision, peripheral edema or shortness of breath. Risk factors for coronary artery disease include dyslipidemia, male gender and sedentary lifestyle. Past treatments include ACE inhibitors. The current treatment provides mild improvement.  ?Hyperlipidemia ?This is a chronic problem. The current episode started more than 1 year ago. Pertinent negatives include no shortness of breath. Current antihyperlipidemic treatment includes statins. The current treatment provides moderate improvement of lipids. Risk factors for coronary artery disease include dyslipidemia, male sex, hypertension and a sedentary lifestyle.  ?Diabetes ?He presents for his follow-up diabetic visit. He has type 2 diabetes mellitus. Pertinent negatives for diabetes include no blurred vision and no foot paresthesias. Symptoms are stable. Risk factors for coronary artery disease include dyslipidemia, diabetes mellitus, male sex, hypertension and sedentary lifestyle. He is following a generally healthy diet. (Does not check at home) An ACE inhibitor/angiotensin II receptor blocker is being taken. Eye exam is not current.  ? ? ? ?Review of Systems  ?Constitutional:  Positive for malaise/fatigue.  ?Eyes:  Negative for blurred vision.  ?Respiratory:  Negative for shortness of breath.   ?All other systems reviewed  and are negative. ? ?Family History  ?Problem Relation Age of Onset  ? Heart disease Mother   ? COPD Father   ? ?Social History  ? ?Socioeconomic History  ? Marital status: Legally Separated  ?  Spouse name: Not on file  ? Number of children: Not on file  ? Years of education: Not on file  ? Highest education level: 8th grade  ?Occupational History  ? Occupation: Retired  ?Tobacco Use  ? Smoking status: Former  ? Smokeless tobacco: Current  ?  Types: Chew  ?Substance and Sexual Activity  ? Alcohol use: No  ? Drug use: No  ? Sexual activity: Not Currently  ?Other Topics Concern  ? Not on file  ?Social History Narrative  ? Lives alone. Family nearby. Talks to son daily. 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 ?Social Determinants of Health  ? ?Financial Resource Strain: Low Risk   ? Difficulty of Paying Living Expenses: Not hard at all  ?Food Insecurity: No Food Insecurity  ? Worried About Charity fundraiser in the Last Year: Never true  ? Ran Out of Food in the Last Year: Never true  ?Transportation Needs: No Transportation Needs  ? Lack of Transportation (Medical): No  ? Lack of Transportation (Non-Medical): No  ?Physical Activity: Sufficiently Active  ? Days of Exercise per Week: 7 days  ? Minutes of Exercise per Session: 60 min  ?Stress: No Stress Concern Present  ? Feeling of Stress : Not at all  ?Social Connections: Socially Isolated  ? Frequency of Communication with Friends and Family: More than three times a week  ? Frequency of Social Gatherings with Friends and Family:  Once a week  ? Attends Religious Services: Never  ? Active Member of Clubs or Organizations: No  ? Attends Archivist Meetings: Never  ? Marital Status: Separated  ? ? ?   ?Objective:  ? Physical Exam ?Vitals reviewed.  ?Constitutional:   ?   General: He is not in acute distress. ?   Appearance: He is well-developed.  ?HENT:  ?   Head: Normocephalic.  ?   Right Ear: Tympanic membrane normal.  ?   Left Ear: Tympanic membrane  normal.  ?Eyes:  ?   General:     ?   Right eye: No discharge.     ?   Left eye: No discharge.  ?   Pupils: Pupils are equal, round, and reactive to light.  ?Neck:  ?   Thyroid: No thyromegaly.  ?Cardiovascular:  ?   Rate and Rhythm: Normal rate and regular rhythm.  ?   Heart sounds: Normal heart sounds. No murmur heard. ?Pulmonary:  ?   Effort: Pulmonary effort is normal. No respiratory distress.  ?   Breath sounds: Normal breath sounds. No wheezing.  ?Abdominal:  ?   General: Bowel sounds are normal. There is no distension.  ?   Palpations: Abdomen is soft.  ?   Tenderness: There is no abdominal tenderness.  ?Musculoskeletal:     ?   General: No tenderness. Normal range of motion.  ?   Cervical back: Normal range of motion and neck supple.  ?Skin: ?   General: Skin is warm and dry.  ?   Findings: No erythema or rash.  ?Neurological:  ?   Mental Status: He is alert and oriented to person, place, and time.  ?   Cranial Nerves: No cranial nerve deficit.  ?   Deep Tendon Reflexes: Reflexes are normal and symmetric.  ?Psychiatric:     ?   Behavior: Behavior normal.     ?   Thought Content: Thought content normal.     ?   Judgment: Judgment normal.  ? ? ? ? ?BP (!) 165/81   Pulse 72   Temp 98.4 ?F (36.9 ?C) (Temporal)   Ht _0  (1.778 m)   Wt 161 lb 12.8 oz (73.4 kg)   BMI 23.22 kg/m?  ? ?   ?Assessment & Plan:  ?WLLIAM Washington comes in today with chief complaint of Medical Management of Chronic Issues ? ? ?Diagnosis and orders addressed: ? ?1. Essential hypertension ?Will decrease to 5 mg ?Pt states he has only been taking 5 mg daily and some times "skips" ?- CBC with Differential/Platelet ?- CMP14+EGFR ?- lisinopril (ZESTRIL) 5 MG tablet; Take 1 tablet (5 mg total) by mouth daily.  Dispense: 90 tablet; Refill: 3 ? ?2. Annual physical exam ?- CBC with Differential/Platelet ?- CMP14+EGFR ?- Lipid panel ?- PSA, total and free ?- TSH ?- Bayer DCA Hb A1c Waived ? ?3. Prediabetes ?- CBC with Differential/Platelet ?-  CMP14+EGFR ?- Bayer DCA Hb A1c Waived ? ?4. Hyperlipidemia, unspecified hyperlipidemia type ?- atorvastatin (LIPITOR) 20 MG tablet; Take 1 tablet (20 mg total) by mouth daily. (NEEDS TO BE SEEN BEFORE NEXT REFILL)  Dispense: 90 tablet; Refill: 3 ?- CBC with Differential/Platelet ?- CMP14+EGFR ?- Lipid panel ? ? ?Labs pending ?Health Maintenance reviewed ?Diet and exercise encouraged ? ?Follow up plan: ?6 months  ? ?Evelina Dun, FNP ? ? ? ?

## 2021-03-25 NOTE — Patient Instructions (Signed)

## 2021-03-26 LAB — LIPID PANEL

## 2021-03-27 LAB — CMP14+EGFR
ALT: 13 IU/L (ref 0–44)
AST: 22 IU/L (ref 0–40)
Albumin/Globulin Ratio: 1.8 (ref 1.2–2.2)
Albumin: 4.6 g/dL (ref 3.7–4.7)
Alkaline Phosphatase: 99 IU/L (ref 44–121)
BUN/Creatinine Ratio: 15 (ref 10–24)
BUN: 18 mg/dL (ref 8–27)
Bilirubin Total: 0.5 mg/dL (ref 0.0–1.2)
CO2: 22 mmol/L (ref 20–29)
Calcium: 9.4 mg/dL (ref 8.6–10.2)
Chloride: 105 mmol/L (ref 96–106)
Creatinine, Ser: 1.19 mg/dL (ref 0.76–1.27)
Globulin, Total: 2.5 g/dL (ref 1.5–4.5)
Glucose: 110 mg/dL — ABNORMAL HIGH (ref 70–99)
Potassium: 4.8 mmol/L (ref 3.5–5.2)
Sodium: 142 mmol/L (ref 134–144)
Total Protein: 7.1 g/dL (ref 6.0–8.5)
eGFR: 64 mL/min/{1.73_m2} (ref >59)

## 2021-03-27 LAB — LIPID PANEL
Chol/HDL Ratio: 2.8 ratio (ref 0.0–5.0)
Cholesterol, Total: 117 mg/dL (ref 100–199)
HDL: 42 mg/dL (ref >39)
LDL Chol Calc (NIH): 60 mg/dL (ref 0–99)
Triglycerides: 73 mg/dL (ref 0–149)
VLDL Cholesterol Cal: 15 mg/dL (ref 5–40)

## 2021-03-27 LAB — CBC WITH DIFFERENTIAL/PLATELET
Basophils Absolute: 0.1 10*3/uL (ref 0.0–0.2)
Basos: 1 %
EOS (ABSOLUTE): 0.1 10*3/uL (ref 0.0–0.4)
Eos: 2 %
Hematocrit: 44.2 % (ref 37.5–51.0)
Hemoglobin: 15.3 g/dL (ref 13.0–17.7)
Immature Grans (Abs): 0 10*3/uL (ref 0.0–0.1)
Immature Granulocytes: 0 %
Lymphocytes Absolute: 1.9 10*3/uL (ref 0.7–3.1)
Lymphs: 27 %
MCH: 32.5 pg (ref 26.6–33.0)
MCHC: 34.6 g/dL (ref 31.5–35.7)
MCV: 94 fL (ref 79–97)
Monocytes Absolute: 0.6 10*3/uL (ref 0.1–0.9)
Monocytes: 8 %
Neutrophils Absolute: 4.2 10*3/uL (ref 1.4–7.0)
Neutrophils: 62 %
Platelets: 232 10*3/uL (ref 150–450)
RBC: 4.71 x10E6/uL (ref 4.14–5.80)
RDW: 12.4 % (ref 11.6–15.4)
WBC: 6.9 10*3/uL (ref 3.4–10.8)

## 2021-03-27 LAB — PSA, TOTAL AND FREE
PSA, Free Pct: 24.1 %
PSA, Free: 0.41 ng/mL
Prostate Specific Ag, Serum: 1.7 ng/mL (ref 0.0–4.0)

## 2021-03-27 LAB — TSH: TSH: 2.07 u[IU]/mL (ref 0.450–4.500)

## 2021-06-14 ENCOUNTER — Ambulatory Visit (INDEPENDENT_AMBULATORY_CARE_PROVIDER_SITE_OTHER): Payer: Medicare Other

## 2021-06-14 VITALS — Wt 161.0 lb

## 2021-06-14 DIAGNOSIS — Z0000 Encounter for general adult medical examination without abnormal findings: Secondary | ICD-10-CM | POA: Diagnosis not present

## 2021-06-14 NOTE — Patient Instructions (Signed)
Michael Washington , Thank you for taking time to come for your Medicare Wellness Visit. I appreciate your ongoing commitment to your health goals. Please review the following plan we discussed and let me know if I can assist you in the future.   Screening recommendations/referrals: Colonoscopy: Declined Recommended yearly ophthalmology/optometry visit for glaucoma screening and checkup Recommended yearly dental visit for hygiene and checkup  Vaccinations: declines all Influenza vaccine: recommended every fall Pneumococcal vaccine: Recommend once per lifetime Prevnar-20 Tdap vaccine: Done 09/08/2017 - Repeat in 10 years Shingles vaccine: Recommended. Shingrix is 2 doses 2-6 months apart and over 90% effective     Covid-19: Recommend 2 doses one month apart with a booster 6 months later.  Advanced directives: Advance directive discussed with you today. Even though you declined this today, please call our office should you change your mind, and we can give you the proper paperwork for you to fill out.   Conditions/risks identified: Keep up the great work! Aim for 30 minutes of exercise or brisk walking, 6-8 glasses of water, and 5 servings of fruits and vegetables each day.   Next appointment: Follow up in one year for your annual wellness visit.   Preventive Care 60 Years and Older, Male  Preventive care refers to lifestyle choices and visits with your health care provider that can promote health and wellness. What does preventive care include? A yearly physical exam. This is also called an annual well check. Dental exams once or twice a year. Routine eye exams. Ask your health care provider how often you should have your eyes checked. Personal lifestyle choices, including: Daily care of your teeth and gums. Regular physical activity. Eating a healthy diet. Avoiding tobacco and drug use. Limiting alcohol use. Practicing safe sex. Taking low doses of aspirin every day. Taking vitamin and  mineral supplements as recommended by your health care provider. What happens during an annual well check? The services and screenings done by your health care provider during your annual well check will depend on your age, overall health, lifestyle risk factors, and family history of disease. Counseling  Your health care provider may ask you questions about your: Alcohol use. Tobacco use. Drug use. Emotional well-being. Home and relationship well-being. Sexual activity. Eating habits. History of falls. Memory and ability to understand (cognition). Work and work Statistician. Screening  You may have the following tests or measurements: Height, weight, and BMI. Blood pressure. Lipid and cholesterol levels. These may be checked every 5 years, or more frequently if you are over 74 years old. Skin check. Lung cancer screening. You may have this screening every year starting at age 74 if you have a 30-pack-year history of smoking and currently smoke or have quit within the past 15 years. Fecal occult blood test (FOBT) of the stool. You may have this test every year starting at age 74. Flexible sigmoidoscopy or colonoscopy. You may have a sigmoidoscopy every 5 years or a colonoscopy every 10 years starting at age 74. Prostate cancer screening. Recommendations will vary depending on your family history and other risks. Hepatitis C blood test. Hepatitis B blood test. Sexually transmitted disease (STD) testing. Diabetes screening. This is done by checking your blood sugar (glucose) after you have not eaten for a while (fasting). You may have this done every 1-3 years. Abdominal aortic aneurysm (AAA) screening. You may need this if you are a current or former smoker. Osteoporosis. You may be screened starting at age 74 if you are at high risk.  Talk with your health care provider about your test results, treatment options, and if necessary, the need for more tests. Vaccines  Your health care  provider may recommend certain vaccines, such as: Influenza vaccine. This is recommended every year. Tetanus, diphtheria, and acellular pertussis (Tdap, Td) vaccine. You may need a Td booster every 10 years. Zoster vaccine. You may need this after age 74. Pneumococcal 13-valent conjugate (PCV13) vaccine. One dose is recommended after age 74. Pneumococcal polysaccharide (PPSV23) vaccine. One dose is recommended after age 74. Talk to your health care provider about which screenings and vaccines you need and how often you need them. This information is not intended to replace advice given to you by your health care provider. Make sure you discuss any questions you have with your health care provider. Document Released: 02/06/2015 Document Revised: 09/30/2015 Document Reviewed: 11/11/2014 Elsevier Interactive Patient Education  2017 [employer] Prevention in the Home Falls can cause injuries. They can happen to people of all ages. There are many things you can do to make your home safe and to help prevent falls. What can I do on the outside of my home? Regularly fix the edges of walkways and driveways and fix any cracks. Remove anything that might make you trip as you walk through a door, such as a raised step or threshold. Trim any bushes or trees on the path to your home. Use bright outdoor lighting. Clear any walking paths of anything that might make someone trip, such as rocks or tools. Regularly check to see if handrails are loose or broken. Make sure that both sides of any steps have handrails. Any raised decks and porches should have guardrails on the edges. Have any leaves, snow, or ice cleared regularly. Use sand or salt on walking paths during winter. Clean up any spills in your garage right away. This includes oil or grease spills. What can I do in the bathroom? Use night lights. Install grab bars by the toilet and in the tub and shower. Do not use towel bars as grab  bars. Use non-skid mats or decals in the tub or shower. If you need to sit down in the shower, use a plastic, non-slip stool. Keep the floor dry. Clean up any water that spills on the floor as soon as it happens. Remove soap buildup in the tub or shower regularly. Attach bath mats securely with double-sided non-slip rug tape. Do not have throw rugs and other things on the floor that can make you trip. What can I do in the bedroom? Use night lights. Make sure that you have a light by your bed that is easy to reach. Do not use any sheets or blankets that are too big for your bed. They should not hang down onto the floor. Have a firm chair that has side arms. You can use this for support while you get dressed. Do not have throw rugs and other things on the floor that can make you trip. What can I do in the kitchen? Clean up any spills right away. Avoid walking on wet floors. Keep items that you use a lot in easy-to-reach places. If you need to reach something above you, use a strong step stool that has a grab bar. Keep electrical cords out of the way. Do not use floor polish or wax that makes floors slippery. If you must use wax, use non-skid floor wax. Do not have throw rugs and other things on the floor that can make  you trip. What can I do with my stairs? Do not leave any items on the stairs. Make sure that there are handrails on both sides of the stairs and use them. Fix handrails that are broken or loose. Make sure that handrails are as long as the stairways. Check any carpeting to make sure that it is firmly attached to the stairs. Fix any carpet that is loose or worn. Avoid having throw rugs at the top or bottom of the stairs. If you do have throw rugs, attach them to the floor with carpet tape. Make sure that you have a light switch at the top of the stairs and the bottom of the stairs. If you do not have them, ask someone to add them for you. What else can I do to help prevent  falls? Wear shoes that: Do not have high heels. Have rubber bottoms. Are comfortable and fit you well. Are closed at the toe. Do not wear sandals. If you use a stepladder: Make sure that it is fully opened. Do not climb a closed stepladder. Make sure that both sides of the stepladder are locked into place. Ask someone to hold it for you, if possible. Clearly mark and make sure that you can see: Any grab bars or handrails. First and last steps. Where the edge of each step is. Use tools that help you move around (mobility aids) if they are needed. These include: Canes. Walkers. Scooters. Crutches. Turn on the lights when you go into a dark area. Replace any light bulbs as soon as they burn out. Set up your furniture so you have a clear path. Avoid moving your furniture around. If any of your floors are uneven, fix them. If there are any pets around you, be aware of where they are. Review your medicines with your doctor. Some medicines can make you feel dizzy. This can increase your chance of falling. Ask your doctor what other things that you can do to help prevent falls. This information is not intended to replace advice given to you by your health care provider. Make sure you discuss any questions you have with your health care provider. Document Released: 11/06/2008 Document Revised: 06/18/2015 Document Reviewed: 02/14/2014 Elsevier Interactive Patient Education  2017 [employer].

## 2021-06-14 NOTE — Progress Notes (Signed)
Subjective:   Michael Washington is a 76 y.o. male who presents for Medicare Annual/Subsequent preventive examination.  Virtual Visit via Telephone Note  I connected with  Michael Washington on 06/14/21 at  8:15 AM EDT by telephone and verified that I am speaking with the correct person using two identifiers.  Location: Patient: Home Provider: [facility] Persons participating in the virtual visit: patient/Nurse Health Advisor   I discussed the limitations, risks, security and privacy concerns of performing an evaluation and management service by telephone and the availability of in person appointments. The patient expressed understanding and agreed to proceed.  Interactive audio and video telecommunications were attempted between this nurse and patient, however failed, due to patient having technical difficulties OR patient did not have access to video capability.  We continued and completed visit with audio only.  Some vital signs may be absent or patient reported.   [name], LPN   Review of Systems     Cardiac Risk Factors include: advanced age (>5men, >61 women);dyslipidemia;hypertension;male gender     Objective:    Today's Vitals   06/14/21 0818  Weight: 161 lb (73 kg)   Body mass index is 23.1 kg/m.     06/14/2021    8:23 AM 06/11/2020    8:22 AM 06/11/2019    8:34 AM 06/06/2018    9:07 AM  Advanced Directives  Does Patient Have a Medical Advance Directive? No No No No  Would patient like information on creating a medical advance directive? No - Patient declined Yes (MAU/Ambulatory/Procedural Areas - Information given) No - Patient declined Yes (MAU/Ambulatory/Procedural Areas - Information given)    Current Medications (verified) Outpatient Encounter Medications as of 06/14/2021  Medication Sig   atorvastatin (LIPITOR) 20 MG tablet Take 1 tablet (20 mg total) by mouth daily. (NEEDS TO BE SEEN BEFORE NEXT REFILL)   lisinopril (ZESTRIL) 5 MG tablet Take 1 tablet (5 mg  total) by mouth daily.   No facility-administered encounter medications on file as of 06/14/2021.    Allergies (verified) Patient has no known allergies.   History: Past Medical History:  Diagnosis Date   Hypertension    History reviewed. No pertinent surgical history. Family History  Problem Relation Age of Onset   Heart disease Mother    COPD Father    Social History   Socioeconomic History   Marital status: Legally Separated    Spouse name: Not on file   Number of children: Not on file   Years of education: Not on file   Highest education level: 8th grade  Occupational History   Occupation: Retired  Tobacco Use   Smoking status: Former   Smokeless tobacco: Current    Types: Chew  Substance and Sexual Activity   Alcohol use: No   Drug use: No   Sexual activity: Not Currently  Other Topics Concern   Not on file  Social History Narrative   Lives alone. Family nearby. Talks to son daily. 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   Social Determinants of Radio broadcast assistant Strain: Low Risk    Difficulty of Paying Living Expenses: Not hard at all  Food Insecurity: No Food Insecurity   Worried About Charity fundraiser in the Last Year: Never true   Arboriculturist in the Last Year: Never true  Transportation Needs: No Transportation Needs   Lack of Transportation (Medical): No   Lack of Transportation (Non-Medical): No  Physical Activity:  Sufficiently Active   Days of Exercise per Week: 7 days   Minutes of Exercise per Session: 60 min  Stress: No Stress Concern Present   Feeling of Stress : Not at all  Social Connections: Socially Isolated   Frequency of Communication with Friends and Family: More than three times a week   Frequency of Social Gatherings with Friends and Family: Once a week   Attends Religious Services: Never   Marine scientist or Organizations: No   Attends Music therapist: Never   Marital Status: Separated     Tobacco Counseling Ready to quit: Not Answered Counseling given: Not Answered   Clinical Intake:  Pre-visit preparation completed: Yes  Pain : No/denies pain     BMI - recorded: 23.1 Nutritional Status: BMI of 19-24  Normal Nutritional Risks: None Diabetes: No  How often do you need to have someone help you when you read instructions, pamphlets, or other written materials from your doctor or pharmacy?: 1 - Never  Diabetic? no  Interpreter Needed?: No  Information entered by :: Shyteria Lewis, LPN   Activities of Daily Living    06/14/2021    8:21 AM  In your present state of health, do you have any difficulty performing the following activities:  Hearing? 0  Vision? 0  Difficulty concentrating or making decisions? 0  Walking or climbing stairs? 0  Dressing or bathing? 0  Doing errands, shopping? 0  Preparing Food and eating ? N  Using the Toilet? N  In the past six months, have you accidently leaked urine? N  Do you have problems with loss of bowel control? N  Managing your Medications? N  Managing your Finances? N  Housekeeping or managing your Housekeeping? N    Patient Care Team: Sharion Balloon, FNP as PCP - General (Family Medicine)  Indicate any recent Medical Services you may have received from other than [facility] providers in the past year (date may be approximate).     Assessment:   This is a routine wellness examination for [place].  Hearing/Vision screen Hearing Screening - Comments:: Denies hearing difficulties   Vision Screening - Comments:: Denies vision difficulties - no optometrist  Dietary issues and exercise activities discussed: Current Exercise Habits: Home exercise routine, Type of exercise: walking, Time (Minutes): 60, Frequency (Times/Week): 7, Weekly Exercise (Minutes/Week): 420, Intensity: Moderate, Exercise limited by: None identified   Goals Addressed             This Visit's Progress    DIET - EAT MORE FRUITS AND  VEGETABLES   On track    Exercise 150 min/wk Moderate Activity   On track      Depression Screen    06/14/2021    8:21 AM 03/25/2021   11:20 AM 06/16/2020    8:47 AM 06/11/2020    8:15 AM 06/11/2019    8:35 AM 09/26/2017   10:56 AM 09/08/2017   11:29 AM  PHQ 2/9 Scores  PHQ - 2 Score 0 0 0 0 0 0 0  PHQ- 9 Score   0        Fall Risk    06/14/2021    8:19 AM 03/25/2021   11:20 AM 06/11/2020    8:22 AM 06/11/2019    8:35 AM 09/26/2017   10:56 AM  Fall Risk   Falls in the past year? 0 0 0 0 No  Number falls in past yr: 0  0    Injury with Fall? 0  0    Risk for fall due to : No Fall Risks  No Fall Risks    Follow up Falls prevention discussed  Falls prevention discussed      FALL RISK PREVENTION PERTAINING TO THE HOME:  Any stairs in or around the home? No  If so, are there any without handrails? No  Home free of loose throw rugs in walkways, pet beds, electrical cords, etc? Yes  Adequate lighting in your home to reduce risk of falls? Yes   ASSISTIVE DEVICES UTILIZED TO PREVENT FALLS:  Life alert? No  Use of a cane, walker or w/c? No  Grab bars in the bathroom? No  Shower chair or bench in shower? No  Elevated toilet seat or a handicapped toilet? No   TIMED UP AND GO:  Was the test performed? No . Telephonic visit  Cognitive Function:        06/14/2021    8:22 AM 06/11/2019    8:24 AM 06/06/2018    9:13 AM  6CIT Screen  What Year? 0 points 0 points 0 points  What month? 0 points 0 points 0 points  What time? 0 points 0 points 0 points  Count back from 20 0 points 0 points 0 points  Months in reverse 0 points 0 points 0 points  Repeat phrase 0 points 0 points 0 points  Total Score 0 points 0 points 0 points    Immunizations Immunization History  Administered Date(s) Administered   Tdap 09/08/2017    TDAP status: Up to date  Flu Vaccine status: Declined, Education has been provided regarding the importance of this vaccine but patient still declined. Advised may  receive this vaccine at local pharmacy or Health Dept. Aware to provide a copy of the vaccination record if obtained from local pharmacy or Health Dept. Verbalized acceptance and understanding.  Pneumococcal vaccine status: Declined,  Education has been provided regarding the importance of this vaccine but patient still declined. Advised may receive this vaccine at local pharmacy or Health Dept. Aware to provide a copy of the vaccination record if obtained from local pharmacy or Health Dept. Verbalized acceptance and understanding.   Covid-19 vaccine status: Declined, Education has been provided regarding the importance of this vaccine but patient still declined. Advised may receive this vaccine at local pharmacy or Health Dept.or vaccine clinic. Aware to provide a copy of the vaccination record if obtained from local pharmacy or Health Dept. Verbalized acceptance and understanding.  Qualifies for Shingles Vaccine? Yes   Zostavax completed No   Shingrix Completed?: No.    Education has been provided regarding the importance of this vaccine. Patient has been advised to call insurance company to determine out of pocket expense if they have not yet received this vaccine. Advised may also receive vaccine at local pharmacy or Health Dept. Verbalized acceptance and understanding.  Screening Tests Health Maintenance  Topic Date Due   COVID-19 Vaccine (1) Never done   Zoster Vaccines- Shingrix (1 of 2) 06/25/2021 (Originally 01/19/1998)   Pneumonia Vaccine 54+ Years old (1 - PCV) 03/26/2022 (Originally 01/19/2013)   Fecal DNA (Cologuard)  03/26/2022 (Originally 01/19/1993)   INFLUENZA VACCINE  08/24/2021   TETANUS/TDAP  09/09/2027   Hepatitis C Screening  Completed   HPV VACCINES  Aged Out    Health Maintenance  Health Maintenance Due  Topic Date Due   COVID-19 Vaccine (1) Never done    Declines Colonoscopy and Cologuard  Lung Cancer Screening: (Low Dose CT Chest recommended if  Age 49-80  years, 30 pack-year currently smoking OR have quit w/in 15years.) does not qualify.   Additional Screening:  Hepatitis C Screening: does qualify; Completed 09/08/2017  Vision Screening: Recommended annual ophthalmology exams for early detection of glaucoma and other disorders of the eye. Is the patient up to date with their annual eye exam?  No  Who is the provider or what is the name of the office in which the patient attends annual eye exams? none If pt is not established with a provider, would they like to be referred to a provider to establish care? No .   Dental Screening: Recommended annual dental exams for proper oral hygiene  Community Resource Referral / Chronic Care Management: CRR required this visit?  No   CCM required this visit?  No      Plan:     I have personally reviewed and noted the following in the patient's chart:   Medical and social history Use of alcohol, tobacco or illicit drugs  Current medications and supplements including opioid prescriptions. Patient is not currently taking opioid prescriptions. Functional ability and status Nutritional status Physical activity Advanced directives List of other physicians Hospitalizations, surgeries, and ER visits in previous 12 months Vitals Screenings to include cognitive, depression, and falls Referrals and appointments  In addition, I have reviewed and discussed with patient certain preventive protocols, quality metrics, and best practice recommendations. A written personalized care plan for preventive services as well as general preventive health recommendations were provided to patient.     Sandrea Hammond, LPN   [id number]   Nurse Notes: None

## 2022-06-16 ENCOUNTER — Ambulatory Visit (INDEPENDENT_AMBULATORY_CARE_PROVIDER_SITE_OTHER): Payer: Medicare Other

## 2022-06-16 VITALS — Ht 71.0 in | Wt 161.0 lb

## 2022-06-16 DIAGNOSIS — Z0100 Encounter for examination of eyes and vision without abnormal findings: Secondary | ICD-10-CM

## 2022-06-16 DIAGNOSIS — Z0000 Encounter for general adult medical examination without abnormal findings: Secondary | ICD-10-CM

## 2022-06-16 NOTE — Patient Instructions (Signed)
[name] , Thank you for taking time to come for your Medicare Wellness Visit. I appreciate your ongoing commitment to your health goals. Please review the following plan we discussed and let me know if I can assist you in the future.   These are the goals we discussed:  Goals      Client will verbalize knowledge of self management of Hypertension as evidences by BP reading of 140/90 or less; or as defined by provider     DIET - EAT MORE FRUITS AND VEGETABLES     Exercise 150 min/wk Moderate Activity        This is a list of the screening recommended for you and due dates:  Health Maintenance  Topic Date Due   COVID-19 Vaccine (1) Never done   Cologuard (Stool DNA test)  Never done   Zoster (Shingles) Vaccine (1 of 2) Never done   Pneumonia Vaccine (1 of 1 - PCV) Never done   Flu Shot  08/25/2022   Medicare Annual Wellness Visit  06/16/2023   DTaP/Tdap/Td vaccine (2 - Td or Tdap) 09/09/2027   Hepatitis C Screening: USPSTF Recommendation to screen - Ages 70-79 yo.  Completed   HPV Vaccine  Aged Out    Advanced directives: Advance directive discussed with you today. I have provided a copy for you to complete at home and have notarized. Once this is complete please bring a copy in to our office so we can scan it into your chart.   Conditions/risks identified: Aim for 30 minutes of exercise or brisk walking, 6-8 glasses of water, and 5 servings of fruits and vegetables each day.   Next appointment: Follow up in one year for your annual wellness visit.   Preventive Care 75 Years and Older, Male  Preventive care refers to lifestyle choices and visits with your health care provider that can promote health and wellness. What does preventive care include? A yearly physical exam. This is also called an annual well check. Dental exams once or twice a year. Routine eye exams. Ask your health care provider how often you should have your eyes checked. Personal lifestyle choices,  including: Daily care of your teeth and gums. Regular physical activity. Eating a healthy diet. Avoiding tobacco and drug use. Limiting alcohol use. Practicing safe sex. Taking low doses of aspirin every day. Taking vitamin and mineral supplements as recommended by your health care provider. What happens during an annual well check? The services and screenings done by your health care provider during your annual well check will depend on your age, overall health, lifestyle risk factors, and family history of disease. Counseling  Your health care provider may ask you questions about your: Alcohol use. Tobacco use. Drug use. Emotional well-being. Home and relationship well-being. Sexual activity. Eating habits. History of falls. Memory and ability to understand (cognition). Work and work Astronomer. Screening  You may have the following tests or measurements: Height, weight, and BMI. Blood pressure. Lipid and cholesterol levels. These may be checked every 5 years, or more frequently if you are over 46 years old. Skin check. Lung cancer screening. You may have this screening every year starting at age 76 if you have a 30-pack-year history of smoking and currently smoke or have quit within the past 15 years. Fecal occult blood test (FOBT) of the stool. You may have this test every year starting at age 66. Flexible sigmoidoscopy or colonoscopy. You may have a sigmoidoscopy every 5 years or a colonoscopy every 10  years starting at age 75. Prostate cancer screening. Recommendations will vary depending on your family history and other risks. Hepatitis C blood test. Hepatitis B blood test. Sexually transmitted disease (STD) testing. Diabetes screening. This is done by checking your blood sugar (glucose) after you have not eaten for a while (fasting). You may have this done every 1-3 years. Abdominal aortic aneurysm (AAA) screening. You may need this if you are a current or former  smoker. Osteoporosis. You may be screened starting at age 76 if you are at high risk. Talk with your health care provider about your test results, treatment options, and if necessary, the need for more tests. Vaccines  Your health care provider may recommend certain vaccines, such as: Influenza vaccine. This is recommended every year. Tetanus, diphtheria, and acellular pertussis (Tdap, Td) vaccine. You may need a Td booster every 10 years. Zoster vaccine. You may need this after age 68. Pneumococcal 13-valent conjugate (PCV13) vaccine. One dose is recommended after age 18. Pneumococcal polysaccharide (PPSV23) vaccine. One dose is recommended after age 68. Talk to your health care provider about which screenings and vaccines you need and how often you need them. This information is not intended to replace advice given to you by your health care provider. Make sure you discuss any questions you have with your health care provider. Document Released: 02/06/2015 Document Revised: 09/30/2015 Document Reviewed: 11/11/2014 Elsevier Interactive Patient Education  2017 [employer].  Fall Prevention in the Home Falls can cause injuries. They can happen to people of all ages. There are many things you can do to make your home safe and to help prevent falls. What can I do on the outside of my home? Regularly fix the edges of walkways and driveways and fix any cracks. Remove anything that might make you trip as you walk through a door, such as a raised step or threshold. Trim any bushes or trees on the path to your home. Use bright outdoor lighting. Clear any walking paths of anything that might make someone trip, such as rocks or tools. Regularly check to see if handrails are loose or broken. Make sure that both sides of any steps have handrails. Any raised decks and porches should have guardrails on the edges. Have any leaves, snow, or ice cleared regularly. Use sand or salt on walking paths during  winter. Clean up any spills in your garage right away. This includes oil or grease spills. What can I do in the bathroom? Use night lights. Install grab bars by the toilet and in the tub and shower. Do not use towel bars as grab bars. Use non-skid mats or decals in the tub or shower. If you need to sit down in the shower, use a plastic, non-slip stool. Keep the floor dry. Clean up any water that spills on the floor as soon as it happens. Remove soap buildup in the tub or shower regularly. Attach bath mats securely with double-sided non-slip rug tape. Do not have throw rugs and other things on the floor that can make you trip. What can I do in the bedroom? Use night lights. Make sure that you have a light by your bed that is easy to reach. Do not use any sheets or blankets that are too big for your bed. They should not hang down onto the floor. Have a firm chair that has side arms. You can use this for support while you get dressed. Do not have throw rugs and other things on the floor  that can make you trip. What can I do in the kitchen? Clean up any spills right away. Avoid walking on wet floors. Keep items that you use a lot in easy-to-reach places. If you need to reach something above you, use a strong step stool that has a grab bar. Keep electrical cords out of the way. Do not use floor polish or wax that makes floors slippery. If you must use wax, use non-skid floor wax. Do not have throw rugs and other things on the floor that can make you trip. What can I do with my stairs? Do not leave any items on the stairs. Make sure that there are handrails on both sides of the stairs and use them. Fix handrails that are broken or loose. Make sure that handrails are as long as the stairways. Check any carpeting to make sure that it is firmly attached to the stairs. Fix any carpet that is loose or worn. Avoid having throw rugs at the top or bottom of the stairs. If you do have throw rugs,  attach them to the floor with carpet tape. Make sure that you have a light switch at the top of the stairs and the bottom of the stairs. If you do not have them, ask someone to add them for you. What else can I do to help prevent falls? Wear shoes that: Do not have high heels. Have rubber bottoms. Are comfortable and fit you well. Are closed at the toe. Do not wear sandals. If you use a stepladder: Make sure that it is fully opened. Do not climb a closed stepladder. Make sure that both sides of the stepladder are locked into place. Ask someone to hold it for you, if possible. Clearly mark and make sure that you can see: Any grab bars or handrails. First and last steps. Where the edge of each step is. Use tools that help you move around (mobility aids) if they are needed. These include: Canes. Walkers. Scooters. Crutches. Turn on the lights when you go into a dark area. Replace any light bulbs as soon as they burn out. Set up your furniture so you have a clear path. Avoid moving your furniture around. If any of your floors are uneven, fix them. If there are any pets around you, be aware of where they are. Review your medicines with your doctor. Some medicines can make you feel dizzy. This can increase your chance of falling. Ask your doctor what other things that you can do to help prevent falls. This information is not intended to replace advice given to you by your health care provider. Make sure you discuss any questions you have with your health care provider. Document Released: 11/06/2008 Document Revised: 06/18/2015 Document Reviewed: 02/14/2014 Elsevier Interactive Patient Education  2017 [employer].

## 2022-06-16 NOTE — Progress Notes (Signed)
Subjective:   Michael Washington is a 77 y.o. male who presents for Medicare Annual/Subsequent preventive examination. I connected with  Michael Washington on 06/16/22 by a audio enabled telemedicine application and verified that I am speaking with the correct person using two identifiers.  Patient Location: Home  Provider Location: Home Office  I discussed the limitations of evaluation and management by telemedicine. The patient expressed understanding and agreed to proceed.  Review of Systems     Cardiac Risk Factors include: advanced age (>109men, >24 women);male gender;dyslipidemia     Objective:    Today's Vitals   06/16/22 0818  Weight: 161 lb (73 kg)  Height: 5\' 11"  (1.803 m)   Body mass index is 22.45 kg/m.     06/16/2022    8:23 AM 06/14/2021    8:23 AM 06/11/2020    8:22 AM 06/11/2019    8:34 AM 06/06/2018    9:07 AM  Advanced Directives  Does Patient Have a Medical Advance Directive? No No No No No  Would patient like information on creating a medical advance directive? No - Patient declined No - Patient declined Yes (MAU/Ambulatory/Procedural Areas - Information given) No - Patient declined Yes (MAU/Ambulatory/Procedural Areas - Information given)    Current Medications (verified) Outpatient Encounter Medications as of 06/16/2022  Medication Sig   atorvastatin (LIPITOR) 20 MG tablet Take 1 tablet (20 mg total) by mouth daily. (NEEDS TO BE SEEN BEFORE NEXT REFILL)   lisinopril (ZESTRIL) 5 MG tablet Take 1 tablet (5 mg total) by mouth daily.   No facility-administered encounter medications on file as of 06/16/2022.    Allergies (verified) Patient has no known allergies.   History: Past Medical History:  Diagnosis Date   Hypertension    History reviewed. No pertinent surgical history. Family History  Problem Relation Age of Onset   Heart disease Mother    COPD Father    Social History   Socioeconomic History   Marital status: Legally Separated    Spouse  name: Not on file   Number of children: Not on file   Years of education: Not on file   Highest education level: 8th grade  Occupational History   Occupation: Retired  Tobacco Use   Smoking status: Former   Smokeless tobacco: Current    Types: Chew  Substance and Sexual Activity   Alcohol use: No   Drug use: No   Sexual activity: Not Currently  Other Topics Concern   Not on file  Social History Narrative   Lives alone. Family nearby. Talks to son daily. 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   Social Determinants of Corporate investment banker Strain: Low Risk  (06/16/2022)   Overall Financial Resource Strain (CARDIA)    Difficulty of Paying Living Expenses: Not hard at all  Food Insecurity: No Food Insecurity (06/16/2022)   Hunger Vital Sign    Worried About Running Out of Food in the Last Year: Never true    Ran Out of Food in the Last Year: Never true  Transportation Needs: No Transportation Needs (06/14/2021)   PRAPARE - Administrator, Civil Service (Medical): No    Lack of Transportation (Non-Medical): No  Physical Activity: Insufficiently Active (06/16/2022)   Exercise Vital Sign    Days of Exercise per Week: 3 days    Minutes of Exercise per Session: 30 min  Stress: No Stress Concern Present (06/16/2022)   [employer] of Occupational Health - Occupational  Stress Questionnaire    Feeling of Stress : Not at all  Social Connections: Socially Isolated (06/16/2022)   Social Connection and Isolation Panel [NHANES]    Frequency of Communication with Friends and Family: More than three times a week    Frequency of Social Gatherings with Friends and Family: More than three times a week    Attends Religious Services: Never    Database administrator or Organizations: No    Attends Engineer, structural: Never    Marital Status: Divorced    Tobacco Counseling Ready to quit: Not Answered Counseling given: Not Answered   Clinical  Intake:  Pre-visit preparation completed: Yes  Pain : No/denies pain     Nutritional Risks: None Diabetes: No  How often do you need to have someone help you when you read instructions, pamphlets, or other written materials from your doctor or pharmacy?: 1 - Never  Diabetic?no   Interpreter Needed?: No  Information entered by :: Renie Ora, LPN   Activities of Daily Living    06/16/2022    8:22 AM  In your present state of health, do you have any difficulty performing the following activities:  Hearing? 0  Vision? 0  Difficulty concentrating or making decisions? 0  Walking or climbing stairs? 0  Dressing or bathing? 0  Doing errands, shopping? 0  Preparing Food and eating ? N  Using the Toilet? N  In the past six months, have you accidently leaked urine? N  Do you have problems with loss of bowel control? N  Managing your Medications? N  Managing your Finances? N  Housekeeping or managing your Housekeeping? N    Patient Care Team: Junie Spencer, FNP as PCP - General (Family Medicine)  Indicate any recent Medical Services you may have received from other than [facility] providers in the past year (date may be approximate).     Assessment:   This is a routine wellness examination for [name].  Hearing/Vision screen Vision Screening - Comments:: Referral 06/16/2022  Dietary issues and exercise activities discussed: Current Exercise Habits: Home exercise routine, Type of exercise: walking, Time (Minutes): 30, Frequency (Times/Week): 3, Weekly Exercise (Minutes/Week): 90, Intensity: Mild, Exercise limited by: None identified   Goals Addressed             This Visit's Progress    DIET - EAT MORE FRUITS AND VEGETABLES   On track      Depression Screen    06/16/2022    8:21 AM 06/14/2021    8:21 AM 03/25/2021   11:20 AM 06/16/2020    8:47 AM 06/11/2020    8:15 AM 06/11/2019    8:35 AM 09/26/2017   10:56 AM  PHQ 2/9 Scores  PHQ - 2 Score 0 0 0 0 0 0 0  PHQ- 9  Score    0       Fall Risk    06/16/2022    8:20 AM 06/14/2021    8:19 AM 03/25/2021   11:20 AM 06/11/2020    8:22 AM 06/11/2019    8:35 AM  Fall Risk   Falls in the past year? 0 0 0 0 0  Number falls in past yr: 0 0  0   Injury with Fall? 0 0  0   Risk for fall due to : No Fall Risks No Fall Risks  No Fall Risks   Follow up Falls prevention discussed Falls prevention discussed  Falls prevention discussed     FALL  RISK PREVENTION PERTAINING TO THE HOME:  Any stairs in or around the home? No  If so, are there any without handrails? No  Home free of loose throw rugs in walkways, pet beds, electrical cords, etc? Yes  Adequate lighting in your home to reduce risk of falls? Yes   ASSISTIVE DEVICES UTILIZED TO PREVENT FALLS:  Life alert? No  Use of a cane, walker or w/c? No  Grab bars in the bathroom? No  Shower chair or bench in shower? No  Elevated toilet seat or a handicapped toilet? No       06/16/2022    8:22 AM 06/14/2021    8:22 AM 06/11/2019    8:24 AM 06/06/2018    9:13 AM  6CIT Screen  What Year? 0 points 0 points 0 points 0 points  What month? 0 points 0 points 0 points 0 points  What time? 0 points 0 points 0 points 0 points  Count back from 20 0 points 0 points 0 points 0 points  Months in reverse 0 points 0 points 0 points 0 points  Repeat phrase 0 points 0 points 0 points 0 points  Total Score 0 points 0 points 0 points 0 points    Immunizations Immunization History  Administered Date(s) Administered   Tdap 09/08/2017    TDAP status: Up to date  Flu Vaccine status: Declined, Education has been provided regarding the importance of this vaccine but patient still declined. Advised may receive this vaccine at local pharmacy or Health Dept. Aware to provide a copy of the vaccination record if obtained from local pharmacy or Health Dept. Verbalized acceptance and understanding.  Pneumococcal vaccine status: Declined,  Education has been provided regarding the  importance of this vaccine but patient still declined. Advised may receive this vaccine at local pharmacy or Health Dept. Aware to provide a copy of the vaccination record if obtained from local pharmacy or Health Dept. Verbalized acceptance and understanding.   Covid-19 vaccine status: Declined, Education has been provided regarding the importance of this vaccine but patient still declined. Advised may receive this vaccine at local pharmacy or Health Dept.or vaccine clinic. Aware to provide a copy of the vaccination record if obtained from local pharmacy or Health Dept. Verbalized acceptance and understanding.  Qualifies for Shingles Vaccine? Yes   Zostavax completed No   Shingrix Completed?: No.    Education has been provided regarding the importance of this vaccine. Patient has been advised to call insurance company to determine out of pocket expense if they have not yet received this vaccine. Advised may also receive vaccine at local pharmacy or Health Dept. Verbalized acceptance and understanding.  Screening Tests Health Maintenance  Topic Date Due   COVID-19 Vaccine (1) Never done   Fecal DNA (Cologuard)  Never done   Zoster Vaccines- Shingrix (1 of 2) Never done   Pneumonia Vaccine 86+ Years old (1 of 1 - PCV) Never done   INFLUENZA VACCINE  08/25/2022   Medicare Annual Wellness (AWV)  06/16/2023   DTaP/Tdap/Td (2 - Td or Tdap) 09/09/2027   Hepatitis C Screening  Completed   HPV VACCINES  Aged Out    Health Maintenance  Health Maintenance Due  Topic Date Due   COVID-19 Vaccine (1) Never done   Fecal DNA (Cologuard)  Never done   Zoster Vaccines- Shingrix (1 of 2) Never done   Pneumonia Vaccine 5+ Years old (1 of 1 - PCV) Never done    Colorectal cancer screening: Referral to  GI placed patient has cologuard kit. Pt aware the office will call re: appt.  Lung Cancer Screening: (Low Dose CT Chest recommended if Age 10-80 years, 30 pack-year currently smoking OR have quit w/in  15years.) does not qualify.   Lung Cancer Screening Referral: n/a  Additional Screening:  Hepatitis C Screening: does not qualify; Completed 09/08/2017  Vision Screening: Recommended annual ophthalmology exams for early detection of glaucoma and other disorders of the eye. Is the patient up to date with their annual eye exam?  No  Who is the provider or what is the name of the office in which the patient attends annual eye exams? None referral 06/16/2022 If pt is not established with a provider, would they like to be referred to a provider to establish care? No .   Dental Screening: Recommended annual dental exams for proper oral hygiene  Community Resource Referral / Chronic Care Management: CRR required this visit?  No   CCM required this visit?  No      Plan:     I have personally reviewed and noted the following in the patient's chart:   Medical and social history Use of alcohol, tobacco or illicit drugs  Current medications and supplements including opioid prescriptions. Patient is not currently taking opioid prescriptions. Functional ability and status Nutritional status Physical activity Advanced directives List of other physicians Hospitalizations, surgeries, and ER visits in previous 12 months Vitals Screenings to include cognitive, depression, and falls Referrals and appointments  In addition, I have reviewed and discussed with patient certain preventive protocols, quality metrics, and best practice recommendations. A written personalized care plan for preventive services as well as general preventive health recommendations were provided to patient.     Lorrene Reid, LPN   1/61/0960   Nurse Notes: Patient states he has Cologuard kit at home

## 2022-10-13 ENCOUNTER — Ambulatory Visit: Payer: Medicare Other | Admitting: Family

## 2022-10-19 ENCOUNTER — Encounter: Payer: Self-pay | Admitting: Family

## 2022-10-28 ENCOUNTER — Other Ambulatory Visit: Payer: Self-pay | Admitting: Family

## 2022-10-28 DIAGNOSIS — Z1212 Encounter for screening for malignant neoplasm of rectum: Secondary | ICD-10-CM

## 2022-10-28 DIAGNOSIS — Z1211 Encounter for screening for malignant neoplasm of colon: Secondary | ICD-10-CM

## 2023-06-20 ENCOUNTER — Ambulatory Visit (INDEPENDENT_AMBULATORY_CARE_PROVIDER_SITE_OTHER): Payer: Medicare Other

## 2023-06-20 VITALS — BP 131/74 | HR 77 | Ht 71.0 in | Wt 161.0 lb

## 2023-06-20 DIAGNOSIS — Z0000 Encounter for general adult medical examination without abnormal findings: Secondary | ICD-10-CM

## 2023-06-20 NOTE — Patient Instructions (Signed)
 [name] , Thank you for taking time out of your busy schedule to complete your Annual Wellness Visit with me. I enjoyed our conversation and look forward to speaking with you again next year. I, as well as your care team,  appreciate your ongoing commitment to your health goals. Please review the following plan we discussed and let me know if I can assist you in the future. Your Game plan/ To Do List   Follow up Visits: Next Medicare AWV with our clinical staff: 06/20/24 at 8:00a.m    Next Office Visit with your provider: n/a  Clinician Recommendations:  Aim for 30 minutes of exercise or brisk walking, 6-8 glasses of water, and 5 servings of fruits and vegetables each day. N/a      This is a list of the screening recommended for you and due dates:  Health Maintenance  Topic Date Due   Cologuard (Stool DNA test)  Never done   Pneumonia Vaccine (1 of 1 - PCV) Never done   Zoster (Shingles) Vaccine (1 of 2) Never done   COVID-19 Vaccine (1 - 2024-25 season) 07/05/2024*   Flu Shot  08/25/2023   Medicare Annual Wellness Visit  06/19/2024   DTaP/Tdap/Td vaccine (2 - Td or Tdap) 09/09/2027   Hepatitis C Screening  Completed   HPV Vaccine  Aged Out   Meningitis B Vaccine  Aged Out  *Topic was postponed. The date shown is not the original due date.    Advanced directives: (Declined) Advance directive discussed with you today. Even though you declined this today, please call our office should you change your mind, and we can give you the proper paperwork for you to fill out. Advance Care Planning is important because it:  [x]  Makes sure you receive the medical care that is consistent with your values, goals, and preferences  [x]  It provides guidance to your family and loved ones and reduces their decisional burden about whether or not they are making the right decisions based on your wishes.  Follow the link provided in your after visit summary or read over the paperwork we have mailed to  you to help you started getting your Advance Directives in place. If you need assistance in completing these, please reach out to us  so that we can help you!  See attachments for Preventive Care and Fall Prevention Tips.

## 2023-06-20 NOTE — Progress Notes (Signed)
 Subjective:   Michael Washington is a 78 y.o. who presents for a Medicare Wellness preventive visit.  As a reminder, Annual Wellness Visits don't include a physical exam, and some assessments may be limited, especially if this visit is performed virtually. We may recommend an in-person follow-up visit with your provider if needed.  Visit Complete: Virtual I connected with  [name] on 06/20/23 by a audio enabled telemedicine application and verified that I am speaking with the correct person using two identifiers.  Patient Location: Home  Provider Location: Home Office  I discussed the limitations of evaluation and management by telemedicine. The patient expressed understanding and agreed to proceed.  Vital Signs: Because this visit was a virtual/telehealth visit, some criteria may be missing or patient reported. Any vitals not documented were not able to be obtained and vitals that have been documented are patient reported.  VideoDeclined- This patient declined Librarian, academic. Therefore the visit was completed with audio only.  Persons Participating in Visit: Patient.  AWV Questionnaire: No: Patient Medicare AWV questionnaire was not completed prior to this visit.  Cardiac Risk Factors include: advanced age (>62men, >52 women);dyslipidemia;hypertension;male gender     Objective:     Today's Vitals   06/20/23 0804  BP: 131/74  Pulse: 77  Weight: 161 lb (73 kg)  Height: 5\' 11"  (1.803 m)   Body mass index is 22.45 kg/m.     06/20/2023    8:10 AM 06/16/2022    8:23 AM 06/14/2021    8:23 AM 06/11/2020    8:22 AM 06/11/2019    8:34 AM 06/06/2018    9:07 AM  Advanced Directives  Does Patient Have a Medical Advance Directive? No No No No No No  Would patient like information on creating a medical advance directive?  No - Patient declined No - Patient declined Yes (MAU/Ambulatory/Procedural Areas - Information given) No - Patient declined Yes  (MAU/Ambulatory/Procedural Areas - Information given)    Current Medications (verified) Outpatient Encounter Medications as of 06/20/2023  Medication Sig   atorvastatin  (LIPITOR) 20 MG tablet Take 1 tablet (20 mg total) by mouth daily. (NEEDS TO BE SEEN BEFORE NEXT REFILL)   lisinopril  (ZESTRIL ) 5 MG tablet Take 1 tablet (5 mg total) by mouth daily.   No facility-administered encounter medications on file as of 06/20/2023.    Allergies (verified) Patient has no known allergies.   History: Past Medical History:  Diagnosis Date   Hypertension    History reviewed. No pertinent surgical history. Family History  Problem Relation Age of Onset   Heart disease Mother    COPD Father    Social History   Socioeconomic History   Marital status: Legally Separated    Spouse name: Not on file   Number of children: Not on file   Years of education: Not on file   Highest education level: 8th grade  Occupational History   Occupation: Retired  Tobacco Use   Smoking status: Former   Smokeless tobacco: Current    Types: Chew  Substance and Sexual Activity   Alcohol use: No   Drug use: No   Sexual activity: Not Currently  Other Topics Concern   Not on file  Social History Narrative   Lives alone. Family nearby. Talks to son daily. 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   Social Drivers of Corporate investment banker Strain: Low Risk  (06/20/2023)   Overall Financial Resource Strain (CARDIA)  Difficulty of Paying Living Expenses: Not hard at all  Food Insecurity: No Food Insecurity (06/20/2023)   Hunger Vital Sign    Worried About Running Out of Food in the Last Year: Never true    Ran Out of Food in the Last Year: Never true  Transportation Needs: No Transportation Needs (06/20/2023)   PRAPARE - Administrator, Civil Service (Medical): No    Lack of Transportation (Non-Medical): No  Physical Activity: Insufficiently Active (06/16/2022)   Exercise Vital Sign     Days of Exercise per Week: 3 days    Minutes of Exercise per Session: 30 min  Stress: No Stress Concern Present (06/20/2023)   [employer] of Occupational Health - Occupational Stress Questionnaire    Feeling of Stress : Not at all  Social Connections: Moderately Isolated (06/20/2023)   Social Connection and Isolation Panel [NHANES]    Frequency of Communication with Friends and Family: More than three times a week    Frequency of Social Gatherings with Friends and Family: More than three times a week    Attends Religious Services: More than 4 times per year    Active Member of [employer] or Organizations: No    Attends Engineer, structural: Never    Marital Status: Divorced    Tobacco Counseling Ready to quit: No Counseling given: Yes    Clinical Intake:  Pre-visit preparation completed: Yes  Pain : No/denies pain     BMI - recorded: 22.45 Nutritional Status: BMI of 19-24  Normal Nutritional Risks: None Diabetes: No  Lab Results  Component Value Date   HGBA1C 5.8 (H) 03/25/2021   HGBA1C 5.8 06/16/2020   HGBA1C 5.8 (H) 06/27/2019     How often do you need to have someone help you when you read instructions, pamphlets, or other written materials from your doctor or pharmacy?: 1 - Never  Interpreter Needed?: No  Information entered by :: Alia t/cma   Activities of Daily Living     06/20/2023    8:06 AM  In your present state of health, do you have any difficulty performing the following activities:  Hearing? 0  Vision? 0  Difficulty concentrating or making decisions? 0  Walking or climbing stairs? 0  Dressing or bathing? 0  Doing errands, shopping? 0  Preparing Food and eating ? N  Using the Toilet? N  In the past six months, have you accidently leaked urine? N  Do you have problems with loss of bowel control? N  Managing your Medications? N  Managing your Finances? N  Housekeeping or managing your Housekeeping? N    Patient Care Team: Yevette Hem, FNP as PCP - General (Family Medicine)  Indicate any recent Medical Services you may have received from other than [facility] providers in the past year (date may be approximate).     Assessment:    This is a routine wellness examination for [place].  Hearing/Vision screen Hearing Screening - Comments:: Pt denies hearing dif Vision Screening - Comments:: Pt denies vision dif   Goals Addressed             This Visit's Progress    DIET - EAT MORE FRUITS AND VEGETABLES   On track      Depression Screen     06/20/2023    8:13 AM 06/16/2022    8:21 AM 06/14/2021    8:21 AM 03/25/2021   11:20 AM 06/16/2020    8:47 AM 06/11/2020    8:15  AM 06/11/2019    8:35 AM  PHQ 2/9 Scores  PHQ - 2 Score 0 0 0 0 0 0 0  PHQ- 9 Score 0    0      Fall Risk     06/20/2023    8:10 AM 06/16/2022    8:20 AM 06/14/2021    8:19 AM 03/25/2021   11:20 AM 06/11/2020    8:22 AM  Fall Risk   Falls in the past year? 0 0 0 0 0  Number falls in past yr: 0 0 0  0  Injury with Fall? 0 0 0  0  Risk for fall due to : No Fall Risks No Fall Risks No Fall Risks  No Fall Risks  Follow up Falls evaluation completed Falls prevention discussed Falls prevention discussed  Falls prevention discussed    MEDICARE RISK AT HOME:  Medicare Risk at Home Any stairs in or around the home?: Yes If so, are there any without handrails?: No Home free of loose throw rugs in walkways, pet beds, electrical cords, etc?: Yes Adequate lighting in your home to reduce risk of falls?: Yes Life alert?: No Use of a cane, walker or w/c?: No Grab bars in the bathroom?: No Shower chair or bench in shower?: No Elevated toilet seat or a handicapped toilet?: No  TIMED UP AND GO:  Was the test performed?  no  Cognitive Function: 6CIT completed        06/20/2023    8:14 AM 06/16/2022    8:22 AM 06/14/2021    8:22 AM 06/11/2019    8:24 AM 06/06/2018    9:13 AM  6CIT Screen  What Year? 0 points 0 points 0 points 0 points 0 points   What month? 0 points 0 points 0 points 0 points 0 points  What time? 3 points 0 points 0 points 0 points 0 points  Count back from 20 0 points 0 points 0 points 0 points 0 points  Months in reverse 0 points 0 points 0 points 0 points 0 points  Repeat phrase 0 points 0 points 0 points 0 points 0 points  Total Score 3 points 0 points 0 points 0 points 0 points    Immunizations Immunization History  Administered Date(s) Administered   Tdap 09/08/2017    Screening Tests Health Maintenance  Topic Date Due   Fecal DNA (Cologuard)  Never done   Pneumonia Vaccine 75+ Years old (1 of 1 - PCV) Never done   Zoster Vaccines- Shingrix (1 of 2) Never done   COVID-19 Vaccine (1 - 2024-25 season) 07/05/2024 (Originally 09/25/2022)   INFLUENZA VACCINE  08/25/2023   Medicare Annual Wellness (AWV)  06/19/2024   DTaP/Tdap/Td (2 - Td or Tdap) 09/09/2027   Hepatitis C Screening  Completed   HPV VACCINES  Aged Out   Meningococcal B Vaccine  Aged Out    Health Maintenance  Health Maintenance Due  Topic Date Due   Fecal DNA (Cologuard)  Never done   Pneumonia Vaccine 41+ Years old (1 of 1 - PCV) Never done   Zoster Vaccines- Shingrix (1 of 2) Never done   Health Maintenance Items Addressed: See Nurse Notes  Additional Screening:  Vision Screening: Recommended annual ophthalmology exams for early detection of glaucoma and other disorders of the eye.  Dental Screening: Recommended annual dental exams for proper oral hygiene  Community Resource Referral / Chronic Care Management: CRR required this visit?  No   CCM required this visit?  No   Plan:    I have personally reviewed and noted the following in the patient's chart:   Medical and social history Use of alcohol, tobacco or illicit drugs  Current medications and supplements including opioid prescriptions. Patient is not currently taking opioid prescriptions. Functional ability and status Nutritional status Physical  activity Advanced directives List of other physicians Hospitalizations, surgeries, and ER visits in previous 12 months Vitals Screenings to include cognitive, depression, and falls Referrals and appointments  In addition, I have reviewed and discussed with patient certain preventive protocols, quality metrics, and best practice recommendations. A written personalized care plan for preventive services as well as general preventive health recommendations were provided to patient.   Michaelle Adolphus, CMA   06/20/2023   After Visit Summary: (Declined) Due to this being a telephonic visit, with patients personalized plan was offered to patient but patient Declined AVS at this time   Notes: Pt is aware and encourage to get the following, declined. Pneumonia, shingles vaccines and Colonoscopy Due

## 2023-12-06 ENCOUNTER — Other Ambulatory Visit: Payer: Self-pay | Admitting: Family Medicine
